# Patient Record
Sex: Female | Born: 1990 | Race: Black or African American | Hispanic: No | Smoking: Never smoker
Health system: Southern US, Community
[De-identification: ages and names within clinical notes are randomized; demographics above are authoritative.]

## PROBLEM LIST (undated history)

## (undated) DIAGNOSIS — Z8742 Personal history of other diseases of the female genital tract: Secondary | ICD-10-CM

## (undated) DIAGNOSIS — J45909 Unspecified asthma, uncomplicated: Secondary | ICD-10-CM

## (undated) DIAGNOSIS — E7800 Pure hypercholesterolemia, unspecified: Secondary | ICD-10-CM

## (undated) HISTORY — PX: NO PAST SURGERIES: SHX2092

## (undated) HISTORY — DX: Personal history of other diseases of the female genital tract: Z87.42

## (undated) HISTORY — DX: Unspecified asthma, uncomplicated: J45.909

## (undated) HISTORY — DX: Pure hypercholesterolemia, unspecified: E78.00

---
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
## 2009-05-14 ENCOUNTER — Ambulatory Visit: Payer: Self-pay | Admitting: Family Medicine

## 2009-12-05 ENCOUNTER — Observation Stay: Payer: Self-pay

## 2009-12-17 ENCOUNTER — Observation Stay: Payer: Self-pay | Admitting: Emergency Medicine

## 2009-12-17 ENCOUNTER — Observation Stay: Payer: Self-pay

## 2010-01-04 ENCOUNTER — Inpatient Hospital Stay: Payer: Self-pay | Admitting: Unknown Physician Specialty

## 2016-03-18 DIAGNOSIS — R635 Abnormal weight gain: Secondary | ICD-10-CM | POA: Diagnosis not present

## 2016-03-18 DIAGNOSIS — Z113 Encounter for screening for infections with a predominantly sexual mode of transmission: Secondary | ICD-10-CM | POA: Diagnosis not present

## 2016-09-29 DIAGNOSIS — Z0000 Encounter for general adult medical examination without abnormal findings: Secondary | ICD-10-CM | POA: Diagnosis not present

## 2016-10-21 DIAGNOSIS — J988 Other specified respiratory disorders: Secondary | ICD-10-CM | POA: Diagnosis not present

## 2016-11-10 DIAGNOSIS — S60460A Insect bite (nonvenomous) of right index finger, initial encounter: Secondary | ICD-10-CM | POA: Diagnosis not present

## 2016-11-10 DIAGNOSIS — Z1389 Encounter for screening for other disorder: Secondary | ICD-10-CM | POA: Diagnosis not present

## 2016-11-10 DIAGNOSIS — S50861A Insect bite (nonvenomous) of right forearm, initial encounter: Secondary | ICD-10-CM | POA: Diagnosis not present

## 2017-05-12 ENCOUNTER — Ambulatory Visit (INDEPENDENT_AMBULATORY_CARE_PROVIDER_SITE_OTHER): Payer: BLUE CROSS/BLUE SHIELD | Admitting: Maternal Newborn

## 2017-05-12 ENCOUNTER — Encounter: Payer: Self-pay | Admitting: Maternal Newborn

## 2017-05-12 VITALS — BP 122/74 | HR 90 | Ht 63.0 in | Wt 157.0 lb

## 2017-05-12 DIAGNOSIS — N939 Abnormal uterine and vaginal bleeding, unspecified: Secondary | ICD-10-CM

## 2017-05-12 DIAGNOSIS — Z3201 Encounter for pregnancy test, result positive: Secondary | ICD-10-CM | POA: Diagnosis not present

## 2017-05-12 DIAGNOSIS — Z01419 Encounter for gynecological examination (general) (routine) without abnormal findings: Secondary | ICD-10-CM

## 2017-05-12 DIAGNOSIS — Z113 Encounter for screening for infections with a predominantly sexual mode of transmission: Secondary | ICD-10-CM | POA: Diagnosis not present

## 2017-05-12 LAB — POCT URINE PREGNANCY: Preg Test, Ur: POSITIVE — AB

## 2017-05-12 NOTE — Progress Notes (Signed)
Gynecology Annual Exam  PCP: Patient, No Pcp Per  Chief Complaint:  Chief Complaint  Patient presents with  . Gynecologic Exam    History of Present Illness: Patient is a 26 y.o. G2P1011 presents for annual exam. The patient has no complaints today.   LMP: No LMP recorded (lmp unknown). Patient is not currently having periods (Reason: Irregular Periods). Average Interval: irregular, has monthly cycle but comes at different times Duration of flow: a few days Heavy Menses: no Clots: no Intermenstrual Bleeding: no Postcoital Bleeding: no Dysmenorrhea: no  The patient is sexually active. She currently uses none for contraception. She denies dyspareunia.  The patient does not perform self breast exams.  There is no notable family history of breast or ovarian cancer in her family.  The patient wears seatbelts: yes.   The patient has regular exercise: yes.    The patient denies current symptoms of depression.    Review of Systems  Constitutional: Negative.   HENT: Negative.   Eyes: Negative.   Respiratory: Negative for cough, shortness of breath and wheezing.   Cardiovascular: Negative for chest pain and palpitations.  Gastrointestinal: Positive for constipation.  Genitourinary: Negative for dysuria, flank pain, frequency and urgency.       Tenderness in right and left lower abdomen  Skin: Negative.   Neurological: Negative.   Endo/Heme/Allergies: Negative.   Psychiatric/Behavioral: Negative.   Breasts: Bilateral tenderness. No nipple discharge or shin changes. All other systems negative.  Past Medical History:  Past Medical History:  Diagnosis Date  . Asthma    mild  . History of abnormal cervical Pap smear 2012, 2013, 2016    Past Surgical History:  Past Surgical History:  Procedure Laterality Date  . NO PAST SURGERIES      Gynecologic History:  No LMP recorded (lmp unknown). Patient is not currently having periods (Reason: Irregular Periods). Contraception:  was using OCP but stopped a few weeks ago because her insurance no longer covered her Lo Lo Estrin Last Pap: Results were: NIL and HR HPV negative   Obstetric History: G2P1011  Family History:  Family History  Problem Relation Age of Onset  . Diabetes Paternal Grandmother     Social History:  Social History   Social History  . Marital status: Single    Spouse name: N/A  . Number of children: N/A  . Years of education: N/A   Occupational History  . Not on file.   Social History Main Topics  . Smoking status: Never Smoker  . Smokeless tobacco: Never Used  . Alcohol use No  . Drug use: No  . Sexual activity: Yes    Birth control/ protection: None   Other Topics Concern  . Not on file   Social History Narrative  . No narrative on file    Allergies:  No Known Allergies  Medications: Prior to Admission medications   Not on File    Physical Exam Vitals: Blood pressure 122/74, pulse 90, height  (1.6 m), weight 157 lb (71.2 kg).  General: NAD HEENT: normocephalic, anicteric Thyroid: no enlargement, no palpable nodules Pulmonary: No increased work of breathing, CTAB Cardiovascular: RRR, distal pulses 2+ Breast: Breasts symmetrical, bilateral tenderness, no palpable nodules or masses, no skin or nipple retraction present, no nipple discharge.  No axillary or supraclavicular lymphadenopathy. Abdomen: NABS, soft, tender in bilateral lower quadrants, non-distended.  Umbilicus without lesions.  No hepatomegaly, splenomegaly or masses palpable. No evidence of hernia  Genitourinary:  External: Normal external  female genitalia.  Normal urethral meatus, normal  Bartholin's and Skene's glands.    Vagina: Normal vaginal mucosa, no evidence of prolapse.    Cervix: Grossly normal in appearance, no bleeding  Uterus: Non-enlarged, mobile, normal contour.  No CMT  Adnexa: ovaries non-enlarged, no adnexal masses  Rectal: deferred  Lymphatic: no evidence of inguinal  lymphadenopathy Extremities: no edema, erythema, or tenderness Neurologic: Grossly intact Psychiatric: mood appropriate, affect full  Assessment: 26 y.o. G2P1011 routine annual exam, positive pregnancy test.  Plan: Problem List Items Addressed This Visit   Visit Diagnoses    Encounter for annual routine gynecological examination    -  Primary   Positive pregnancy test       Papanicolaou smear, as part of routine gynecological examination       Relevant Orders   IGP,CtNgTv,rfx Aptima HPV ASCU   Screening examination for STD (sexually transmitted disease)       Relevant Orders   IGP,CtNgTv,rfx Aptima HPV ASCU      1) STI screening was offered and accepted (GC and trich).  2) ASCCP guidelines and rationale discussed.  Patient opts for every 3 years screening interval.  3) Contraception - we discussed patient's OCPs and she intended to restart a generic version, but she had a positive pregnancy test today.  4) Routine healthcare maintenance including cholesterol, diabetes screening discussed; declines.  5) Follow up for NOB and dating ultrasound.  Marcelyn Bruins, CNM 05/12/2017  1:23 PM

## 2017-05-12 NOTE — Addendum Note (Signed)
Addended by: Reather Littler D on: 05/12/2017 02:34 PM   Modules accepted: Orders

## 2017-05-14 LAB — IGP,CTNGTV,RFX APTIMA HPV ASCU
Chlamydia, Nuc. Acid Amp: NEGATIVE
Gonococcus, Nuc. Acid Amp: NEGATIVE
PAP Smear Comment: 0
Trich vag by NAA: NEGATIVE

## 2017-05-17 ENCOUNTER — Encounter: Payer: Self-pay | Admitting: Obstetrics & Gynecology

## 2017-05-17 ENCOUNTER — Telehealth: Payer: Self-pay

## 2017-05-17 ENCOUNTER — Ambulatory Visit (INDEPENDENT_AMBULATORY_CARE_PROVIDER_SITE_OTHER): Payer: BLUE CROSS/BLUE SHIELD | Admitting: Obstetrics & Gynecology

## 2017-05-17 VITALS — BP 120/80 | HR 102 | Ht 63.0 in | Wt 159.0 lb

## 2017-05-17 DIAGNOSIS — R1032 Left lower quadrant pain: Secondary | ICD-10-CM | POA: Insufficient documentation

## 2017-05-17 NOTE — Telephone Encounter (Signed)
Pt found out she was preg last Wed and over the wkend she had sharp pelvic pain.  Should she be seen or wait for u/s appt Thurs.?  [phone]  Pt states there is a very tender/sore 'ball' in the pantyline.  appt made today c PH at 2:50

## 2017-05-17 NOTE — Progress Notes (Signed)
  Abdominal Pain Patient presents for evaluation of abdominal pain. The pain is described as aching, and is 4/10 in intensity. Pain is located in the LLQ area with radiation to the left groin. Onset was gradual occurring 2 days ago. Symptoms have been unchanged since. Aggravating factors: none. Alleviating factors: none. Associated symptoms: small lump over groin area. The patient denies fever, nausea, sweats, vomiting and vag d/c, itching, burning, UTI sx's. Risk factors for pelvic/abdominal pain include recent dx of pregnancy after having irreg periods (unsure EDC, [place] this week).  PMHx: She  has a past medical history of Asthma and History of abnormal cervical Pap smear (2012, 2013, 2016). Also,  has a past surgical history that includes No past surgeries., family history includes Diabetes in her paternal grandmother.,  reports that she has never smoked. She has never used smokeless tobacco. She reports that she does not drink alcohol or use drugs.  She currently has no medications in their medication list. Also, has No Known Allergies.  Review of Systems  All other systems reviewed and are negative.  Objective: BP 120/80   Pulse (!) 102   Ht  (1.6 m)   Wt 159 lb (72.1 kg)   LMP  (LMP Unknown)   BMI 28.17 kg/m  Physical Exam  Constitutional: She is oriented to person, place, and time. She appears well-developed and well-nourished. No distress.  Genitourinary: Vagina normal and uterus normal. Pelvic exam was performed with patient supine. There is no rash, tenderness or lesion on the right labia. There is no rash, tenderness or lesion on the left labia. No erythema or bleeding in the vagina. Right adnexum does not display mass and does not display tenderness. Left adnexum does not display mass and does not display tenderness. Cervix does not exhibit motion tenderness, discharge, polyp or nabothian cyst.   Uterus is mobile and midaxial. Uterus is not enlarged or exhibiting a mass.    Abdominal: Soft. She exhibits no distension. There is tenderness in the right lower quadrant and left lower quadrant.  Mild LQ T to palpation  Palpable small probable lymph node over left femoral canal  Musculoskeletal: Normal range of motion.  Neurological: She is alert and oriented to person, place, and time. No cranial nerve deficit.  Skin: Skin is warm and dry.  Psychiatric: She has a normal mood and affect.   ASSESSMENT/PLAN:   Problem List Items Addressed This Visit   Visit Diagnoses    Left lower quadrant pain    -  Primary    Round lig pain or LN pain or abnormal pregnancy. [place] as scheduled, sooner if pain intensifies Monitor for abn bleeding  Annamarie Major, MD, [facility], [facility] 05/17/2017  3:35 PM

## 2017-05-20 ENCOUNTER — Encounter: Payer: Self-pay | Admitting: Obstetrics and Gynecology

## 2017-05-20 ENCOUNTER — Ambulatory Visit (INDEPENDENT_AMBULATORY_CARE_PROVIDER_SITE_OTHER): Payer: BLUE CROSS/BLUE SHIELD

## 2017-05-20 ENCOUNTER — Ambulatory Visit (INDEPENDENT_AMBULATORY_CARE_PROVIDER_SITE_OTHER): Payer: BLUE CROSS/BLUE SHIELD | Admitting: Obstetrics and Gynecology

## 2017-05-20 VITALS — BP 118/74 | Wt 160.0 lb

## 2017-05-20 DIAGNOSIS — Z3201 Encounter for pregnancy test, result positive: Secondary | ICD-10-CM

## 2017-05-20 DIAGNOSIS — Z3687 Encounter for antenatal screening for uncertain dates: Secondary | ICD-10-CM | POA: Diagnosis not present

## 2017-05-20 DIAGNOSIS — Z3490 Encounter for supervision of normal pregnancy, unspecified, unspecified trimester: Secondary | ICD-10-CM | POA: Insufficient documentation

## 2017-05-20 DIAGNOSIS — Z3A01 Less than 8 weeks gestation of pregnancy: Secondary | ICD-10-CM

## 2017-05-20 DIAGNOSIS — Z3491 Encounter for supervision of normal pregnancy, unspecified, first trimester: Secondary | ICD-10-CM

## 2017-05-20 NOTE — Progress Notes (Signed)
New Obstetric Patient H&P   Chief Complaint: "Desires prenatal care"   History of Present Illness: Patient is a 26 y.o. G3P1011 Not Hispanic or Latino female, LMP is unsure and she presents with amenorrhea and positive home pregnancy test. Based on her  Ultrasound today, her EDD is Estimated Date of Delivery: 01/01/18 and her EGA is [redacted]w[redacted]d. She initially presented with LLQ abdominal pain, which is resolved now.   Her prior pregnancies are notable for no complications  Since her LMP, she admits to the use of tobacco products  no She claims she has gained zero pounds since the start of her pregnancy.  There are cats in the home in the home  no  She admits close contact with children on a regular basis  yes  She has had chicken pox in the past yes She has had Tuberculosis exposures, symptoms, or previously tested positive for TB   no Current or past history of domestic violence. no  Genetic Screening/Teratology Counseling: (Includes patient, baby's father, or anyone in either family with:)   1. Patient's age >/= 60 at [facility]  no 2. Thalassemia ([place], [place], Mediterranean, or Asian background): MCV<80  no 3. Neural tube defect (meningomyelocele, spina bifida, anencephaly)  no 4. Congenital heart defect  no  5. Down syndrome  no 6. Tay-Sachs (Jewish, [place]  no 7. Canavan's Disease  no 8. Sickle cell disease or trait (African)  Yes. Sickle cell trait in father of baby.  She has previously tested negative for sickle cell trait in her first pregnancy.  9. Hemophilia or other blood disorders  no  10. Muscular dystrophy  no  11. Cystic fibrosis  no  12. Huntington's Chorea  no  13. Mental retardation/autism  no 14. Other inherited genetic or chromosomal disorder  no 15. Maternal metabolic disorder (DM, PKU, etc)  no 16. Patient or FOB with a child with a birth defect not listed above no  16a. Patient or FOB with a birth defect themselves no 17. Recurrent pregnancy loss, or  stillbirth  no  18. Any medications since LMP other than prenatal vitamins (include vitamins, supplements, OTC meds, drugs, alcohol)  no 19. Any other genetic/environmental exposure to discuss  no  Infection History:   1. Lives with someone with TB or TB exposed  no  2. Patient or partner has history of genital herpes  no 3. Rash or viral illness since LMP  no 4. History of STI (GC, CT, HPV, syphilis, HIV)  no 5. History of recent travel :  no  Other pertinent information:  no     Review of Systems:10 point review of systems negative unless otherwise noted in HPI  Past Medical History:  Diagnosis Date  . Asthma    mild  . History of abnormal cervical Pap smear 2012, 2013, 2016    Past Surgical History:  Procedure Laterality Date  . NO PAST SURGERIES      Gynecologic History: No LMP recorded (lmp unknown). Patient is pregnant.  Obstetric History: G3P1011  Family History  Problem Relation Age of Onset  . Diabetes Paternal Grandmother     Social History   Social History  . Marital status: Single    Spouse name: N/A  . Number of children: N/A  . Years of education: N/A   Occupational History  . Not on file.   Social History Main Topics  . Smoking status: Never Smoker  . Smokeless tobacco: Never Used  . Alcohol use No  . Drug  use: No  . Sexual activity: Yes    Birth control/ protection: None   Other Topics Concern  . Not on file   Social History Narrative  . No narrative on file    No Known Allergies  Prior to Admission medications   PNV    Physical Exam BP 118/74   Wt 160 lb (72.6 kg)   LMP  (LMP Unknown)   BMI 28.34 kg/m   Physical Exam  Constitutional: She is oriented to person, place, and time. She appears well-developed and well-nourished. No distress.  HENT:  Head: Normocephalic and atraumatic.  Eyes: Conjunctivae are normal.  Neck: Normal range of motion. Neck supple. No thyromegaly present.  Cardiovascular: Normal rate, regular  rhythm and normal heart sounds.  Exam reveals no gallop and no friction rub.   No murmur heard. Pulmonary/Chest: Effort normal and breath sounds normal. She has no wheezes.  Abdominal: Soft. She exhibits no distension and no mass. There is no tenderness. There is no rebound and no guarding. No hernia.  Genitourinary: Pelvic exam was performed with patient supine.  Musculoskeletal: Normal range of motion.  Lymphadenopathy:    She has no cervical adenopathy.  Neurological: She is alert and oriented to person, place, and time.  Skin: Skin is warm and dry. No rash noted.  Psychiatric: She has a normal mood and affect. Her behavior is normal. Judgment normal.    Pelvic exam previously performed.   Assessment: 26 y.o. G3P1011 at 3731w5d presenting to initiate prenatal care  Plan: 1) Avoid alcoholic beverages. 2) Patient encouraged not to smoke.  3) Discontinue the use of all non-medicinal drugs and chemicals.  4) Take prenatal vitamins daily.  5) Nutrition, food safety (fish, cheese advisories, and high nitrite foods) and exercise discussed. 6) Hospital and practice style discussed with cross coverage system.  7) Genetic Screening, such as with 1st Trimester Screening, cell free fetal DNA, AFP testing, and Ultrasound, as well as with amniocentesis and CVS as appropriate, is discussed with patient. At the conclusion of today's visit patient undecided genetic testing 8) Patient is asked about travel to areas at risk for the [place] virus, and counseled to avoid travel and exposure to mosquitoes or sexual partners who may have themselves been exposed to the virus. Testing is discussed, and will be ordered as appropriate.   Thomasene MohairStephen Sebastion Jun, MD 05/20/2017 1:17 PM

## 2017-06-17 ENCOUNTER — Ambulatory Visit (INDEPENDENT_AMBULATORY_CARE_PROVIDER_SITE_OTHER): Payer: BLUE CROSS/BLUE SHIELD | Admitting: Obstetrics & Gynecology

## 2017-06-17 VITALS — BP 120/80 | Wt 166.0 lb

## 2017-06-17 DIAGNOSIS — Z3A11 11 weeks gestation of pregnancy: Secondary | ICD-10-CM | POA: Diagnosis not present

## 2017-06-17 DIAGNOSIS — Z3491 Encounter for supervision of normal pregnancy, unspecified, first trimester: Secondary | ICD-10-CM

## 2017-06-17 NOTE — Patient Instructions (Signed)

## 2017-06-17 NOTE — Progress Notes (Signed)
  Subjective  Fetal Movement? no Contractions? no Leaking Fluid? no Vaginal Bleeding? No Occas RLQ pain, mild, no radiation, no other sx's.  Objective  BP 120/80   Wt 166 lb (75.3 kg)   LMP  (LMP Unknown)   BMI 29.41 kg/m  General: NAD Pumonary: no increased work of breathing Abdomen: gravid, non-tender Extremities: no edema Psychiatric: mood appropriate, affect full  Clinic WS Prenatal Labs  Dating US Blood type:     Genetic Screen +DECLINES Antibody:   Anatomic US To be scheduled Rubella:    GTT Third trimester:  RPR:     Flu vaccine  HBsAg:     TDaP vaccine                          Rhogam: HIV:     Baby Food                                               GBS: (For PCN allergy, check sensitivities)  Contraception  Pap:  CS/VBAC    Cord Blood    Support Person     Assessment   26 y.o. [id number] at 1337w5d by  01/01/2018, by Ultrasound presenting for routine prenatal visit  Plan   Problem List Items Addressed This Visit      Other   Supervision of low-risk pregnancy   Relevant Orders   RPR+Rh+ABO+Rub Ab+Ab Scr+CB...   Hemoglobinopathy evaluation    Other Visit Diagnoses    [redacted] weeks gestation of pregnancy    -  Primary   Relevant Orders   RPR+Rh+ABO+Rub Ab+Ab Scr+CB...   Hemoglobinopathy evaluation    PNV. Declines genetic screening tests Annamarie MajorPaul Zavien Clubb, MD, [facility] Frederick[facility]AC[facility], [facility] 06/17/2017  10:41 AM

## 2017-06-18 LAB — HEMOGLOBINOPATHY EVALUATION
HGB C: 0 %
HGB S: 0 %
HGB VARIANT: 0 %
Hemoglobin A2 Quantitation: 2.4 % (ref 1.8–3.2)
Hemoglobin F Quantitation: 0 % (ref 0.0–2.0)
Hgb A: 97.6 % (ref 96.4–98.8)

## 2017-06-18 LAB — RPR+RH+ABO+RUB AB+AB SCR+CB...
ANTIBODY SCREEN: NEGATIVE
HEMATOCRIT: 41.5 % (ref 34.0–46.6)
HEMOGLOBIN: 14.4 g/dL (ref 11.1–15.9)
HIV Screen 4th Generation wRfx: NONREACTIVE
Hepatitis B Surface Ag: NEGATIVE
MCH: 31.6 pg (ref 26.6–33.0)
MCHC: 34.7 g/dL (ref 31.5–35.7)
MCV: 91 fL (ref 79–97)
Platelets: 251 10*3/uL (ref 150–379)
RBC: 4.56 x10E6/uL (ref 3.77–5.28)
RDW: 13.5 % (ref 12.3–15.4)
RH TYPE: POSITIVE
RPR Ser Ql: NONREACTIVE
RUBELLA: 20.4 {index} (ref >0.99)
VARICELLA: 629 {index} (ref >165)
WBC: 12 10*3/uL — AB (ref 3.4–10.8)

## 2017-06-29 ENCOUNTER — Encounter: Payer: Self-pay | Admitting: Emergency Medicine

## 2017-06-29 ENCOUNTER — Other Ambulatory Visit: Payer: Self-pay

## 2017-06-29 ENCOUNTER — Emergency Department: Payer: BLUE CROSS/BLUE SHIELD

## 2017-06-29 ENCOUNTER — Emergency Department
Admission: EM | Admit: 2017-06-29 | Discharge: 2017-06-29 | Disposition: A | Discharge status: Home / Self Care | Payer: BLUE CROSS/BLUE SHIELD | Attending: Emergency Medicine | Admitting: Emergency Medicine

## 2017-06-29 DIAGNOSIS — Z3A13 13 weeks gestation of pregnancy: Secondary | ICD-10-CM | POA: Diagnosis not present

## 2017-06-29 DIAGNOSIS — O200 Threatened abortion: Secondary | ICD-10-CM | POA: Diagnosis not present

## 2017-06-29 DIAGNOSIS — O209 Hemorrhage in early pregnancy, unspecified: Secondary | ICD-10-CM | POA: Diagnosis not present

## 2017-06-29 LAB — CBC
HCT: 38.6 % (ref 35.0–47.0)
Hemoglobin: 13.6 g/dL (ref 12.0–16.0)
MCH: 32.1 pg (ref 26.0–34.0)
MCHC: 35.3 g/dL (ref 32.0–36.0)
MCV: 90.8 fL (ref 80.0–100.0)
PLATELETS: 232 10*3/uL (ref 150–440)
RBC: 4.26 MIL/uL (ref 3.80–5.20)
RDW: 13.1 % (ref 11.5–14.5)
WBC: 10 10*3/uL (ref 3.6–11.0)

## 2017-06-29 LAB — HCG, QUANTITATIVE, PREGNANCY: hCG, Beta Chain, Quant, S: 138152 m[IU]/mL — ABNORMAL HIGH (ref <5)

## 2017-06-29 NOTE — ED Provider Notes (Signed)
[facility] Emergency Department Provider Note  Time seen: 5:04 AM  I have reviewed the triage vital signs and the nursing notes.   HISTORY  Chief Complaint Vaginal Bleeding    HPI Sierra Clay is a 26 y.o. female G2 P1 presents to the emergency department for vaginal bleeding proxy [redacted] weeks pregnant.  According to the patient she is [place] [redacted] weeks pregnant had an ultrasound performed by her OB at 7 weeks.  Patient states around 9 PM tonight she began with vaginal spotting/bleeding.  Denies any abdominal pain or cramping.  Denies any fever.  Denies any history of miscarriage or abortion in the past.  Denies any recent intercourse.   Past Medical History:  Diagnosis Date  . Asthma    mild  . History of abnormal cervical Pap smear 2012, 2013, 2016    Patient Active Problem List   Diagnosis Date Noted  . Supervision of low-risk pregnancy 05/20/2017  . Left lower quadrant pain 05/17/2017    Past Surgical History:  Procedure Laterality Date  . NO PAST SURGERIES      Prior to Admission medications   Not on File    No Known Allergies  Family History  Problem Relation Age of Onset  . Diabetes Paternal Grandmother     Social History Social History   Tobacco Use  . Smoking status: Never Smoker  . Smokeless tobacco: Never Used  Substance Use Topics  . Alcohol use: No  . Drug use: No    Review of Systems Constitutional: Negative for fever. Cardiovascular: Negative for chest pain. Respiratory: Negative for shortness of breath. Gastrointestinal: Negative for abdominal pain Genitourinary: Positive for vaginal bleeding All other ROS negative  ____________________________________________   PHYSICAL EXAM:  VITAL SIGNS: ED Triage Vitals  Enc Vitals Group     BP 06/29/17 0448 140/81     Pulse Rate 06/29/17 0448 100     Resp 06/29/17 0448 20     Temp 06/29/17 0448 98 F (36.7 C)     Temp Source 06/29/17 0448 Oral     SpO2  06/29/17 0448 100 %     Weight 06/29/17 0446 166 lb (75.3 kg)     Height 06/29/17 0446 5\' 1"  (1.549 m)     Head Circumference --      Peak Flow --      Pain Score --      Pain Loc --      Pain Edu? --      Excl. in GC? --     Constitutional: Alert and oriented. Well appearing and in no distress. Eyes: Normal exam ENT   Head: Normocephalic and atraumatic   Mouth/Throat: Mucous membranes are moist. Cardiovascular: Normal rate, regular rhythm. No murmur Respiratory: Normal respiratory effort without tachypnea nor retractions. Breath sounds are clear  Gastrointestinal: Soft and nontender. No distention.   Musculoskeletal: Nontender with normal range of motion in all extremities.  Neurologic:  Normal speech and language. No gross focal neurologic deficits Skin:  Skin is warm, dry and intact.  Psychiatric: Mood and affect are normal.   ____________________________________________   RADIOLOGY  IMPRESSION: 1. Appearance highly suspicious for Placenta Previa. 2. Single living IUP with estimated gestational age of [redacted] weeks and 1 day. 3. The cervix appears closed with an estimated length of 4.6 cm. No subchorionic hemorrhage or pelvic free fluid. Normal ovaries.  ____________________________________________   INITIAL IMPRESSION / ASSESSMENT AND PLAN / ED COURSE  Pertinent labs & imaging results that  were available during my care of the patient were reviewed by me and considered in my medical decision making (see chart for details).  Patient presents to the emergency department for vaginal bleeding proximal by [redacted] weeks pregnant.  Differential would include miscarriage, threatened miscarriage, subchorionic hemorrhage.  Overall the patient appears very well, completely nontender abdominal exam.  We will check labs and obtain an ultrasound to further evaluate.  Patient agreeable to this plan of care.  Records reviewed, noncontributory to today's ER visit.  Patient's ultrasound  is highly suspicious for placenta previa otherwise shows a single live IUP at 13 weeks and 1 day no subchorionic hemorrhage noted.  Given the possible placenta previa we will [facility].  Patient's ultrasound shows possible placenta previa otherwise looks normal.  I discussed the patient with Dr. Jean RosenthalJackson of OB/GYN.  Patient will follow-up in the office.  I discussed the findings with the patient she is agreeable to this plan.  ____________________________________________   FINAL CLINICAL IMPRESSION(S) / ED DIAGNOSES  Threatened miscarriage    Minna AntisPaduchowski, Domonic Kimball, MD 06/29/17 [phone]

## 2017-06-29 NOTE — ED Notes (Signed)
Patient transported to Ultrasound 

## 2017-06-29 NOTE — ED Notes (Signed)
Patient returned from US.

## 2017-06-29 NOTE — ED Notes (Signed)
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  Pt denies pain, but states "it was a lot of blood" when asked how much blood was in her undergarments.  Patient in NAD during assessment, with support at bedside.

## 2017-06-29 NOTE — ED Triage Notes (Signed)
Patient ambulatory to triage with steady gait, without difficulty or distress noted; pt reports approx [redacted]wks pregnant, vag bleeding since 10pm with no pain; [facility]

## 2017-07-01 ENCOUNTER — Ambulatory Visit (INDEPENDENT_AMBULATORY_CARE_PROVIDER_SITE_OTHER): Payer: BLUE CROSS/BLUE SHIELD | Admitting: Certified Nurse Midwife

## 2017-07-01 VITALS — BP 102/62 | Wt 164.0 lb

## 2017-07-01 DIAGNOSIS — R112 Nausea with vomiting, unspecified: Secondary | ICD-10-CM

## 2017-07-01 DIAGNOSIS — Z3A13 13 weeks gestation of pregnancy: Secondary | ICD-10-CM

## 2017-07-01 DIAGNOSIS — O4692 Antepartum hemorrhage, unspecified, second trimester: Secondary | ICD-10-CM

## 2017-07-01 MED ORDER — ONDANSETRON HCL 4 MG PO TABS: 4.0000 mg | ORAL_TABLET | Freq: Four times a day (QID) | ORAL | 1 refills | Status: DC | PRN

## 2017-07-01 NOTE — Progress Notes (Signed)
Pt f/u from ER for bleeding, per pt she could fill pad after leaving hospital. She did not bleed at all yesterday until last night.

## 2017-07-03 NOTE — Progress Notes (Signed)
26 yr old G3 P1011 at 13wk5d presents for a follow up visit after being seen in the ER 11/27/ for vaginal bleeding Her bleeding was preceded by a sudden onset of nausea and vomiting on 11/26. Still having some problems with nausea and vomiting Keeping down some fluids and food. No diarrhea Bleeding subsiding, just had some brown discharge last night. Having some BLA cramping (?round ligament pain) Ultrasound 11/27 in ER revealed a viable SIUP with CRL c/w 13wk 1 day, the placenta was 7mm from cx os Blood type O POS Exam: 102/62 General: in NAD Abdomen: soft, NT, FH at U-3, FHTWNL, BS active A: Threatened AB at 13wk5d Low lying placenta Nausea and vomiting-possibly due to GI virus P: Zofran 4 mgm po for nausea and vomiting Clar liquids then bland diet then advance as tolerated Pelvic rest until no bleeding x 2 weeks FU next week as scheduled Sierra Clay, CNM

## 2017-07-14 ENCOUNTER — Ambulatory Visit (INDEPENDENT_AMBULATORY_CARE_PROVIDER_SITE_OTHER): Payer: BLUE CROSS/BLUE SHIELD | Admitting: Obstetrics and Gynecology

## 2017-07-14 ENCOUNTER — Encounter: Payer: BLUE CROSS/BLUE SHIELD | Admitting: Obstetrics and Gynecology

## 2017-07-14 VITALS — BP 116/62 | Wt 170.0 lb

## 2017-07-14 DIAGNOSIS — Z3491 Encounter for supervision of normal pregnancy, unspecified, first trimester: Secondary | ICD-10-CM

## 2017-07-14 DIAGNOSIS — Z3A15 15 weeks gestation of pregnancy: Secondary | ICD-10-CM

## 2017-07-14 DIAGNOSIS — O4440 Low lying placenta NOS or without hemorrhage, unspecified trimester: Secondary | ICD-10-CM

## 2017-07-14 NOTE — Progress Notes (Signed)
    Routine Prenatal Care Visit  Subjective  Sierra Clay is a 26 y.o. G3P1011 at 5822w5d being seen today for ongoing prenatal care.  She is currently monitored for the following issues for this low-risk pregnancy and has Left lower quadrant pain and Supervision of low-risk pregnancy on their problem list.  ----------------------------------------------------------------------------------- Patient reports a small amount of vaginal bleeding.   Contractions: Not present. Vag. Bleeding: Small.   . Denies leaking of fluid.  ----------------------------------------------------------------------------------- The following portions of the patient's history were reviewed and updated as appropriate: allergies, current medications, past family history, past medical history, past social history, past surgical history and problem list. Problem list updated.   Objective  Blood pressure 116/62, weight 170 lb (77.1 kg). Pregravid weight 152 lb (68.9 kg) Total Weight Gain 18 lb (8.165 kg) Urinalysis:      Fetal Status: Fetal Heart Rate (bpm): 160         General:  Alert, oriented and cooperative. Patient is in no acute distress.  Skin: Skin is warm and dry. No rash noted.   Cardiovascular: Normal heart rate noted  Respiratory: Normal respiratory effort, no problems with respiration noted  Abdomen: Soft, gravid, appropriate for gestational age. Pain/Pressure: Absent     Pelvic:  Cervical exam deferred        Extremities: Normal range of motion.     ental Status: Normal mood and affect. Normal behavior. Normal judgment and thought content.     Assessment   26 y.o. G3P1011 at 7022w5d by  01/01/2018, by Ultrasound presenting for routine prenatal visit  Plan   pregnancy Problems (from 05/20/17 to present)    Problem Noted Resolved   Supervision of low-risk pregnancy 05/20/2017 by Conard NovakJackson, Stephen D, MD No   Overview Signed 07/14/2017  2:38 PM by Natale MilchSchuman, Myliah Medel R, MD      [facility]  Prenatal Labs  Dating  Blood type: O/Positive/-- (11/15 1054)   Genetic Screen Declines Antibody:Negative (11/15 1054)  Anatomic US  Rubella: 20.40 (11/15 1054) Varicella: @VZVIGG @  GTT Early:        28 wk:      RPR: Non Reactive (11/15 1054)   Rhogam  HBsAg: Negative (11/15 1054)   TDaP vaccine                       HIV:     Flu Shot                                GBS:   Contraception  Pap:  CBB     CS/VBAC    Baby Food    Support Person                 Preterm labor symptoms and general obstetric precautions including but not limited to vaginal bleeding, contractions, leaking of fluid and fetal movement were reviewed in detail with the patient. Please refer to After Visit Summary for other counseling recommendations.   Patient has a low lying placenta which was suspicious for a previa as of a scan in November. Discussed pelvic rest with patient.   Return in about 4 weeks (around 08/11/2017) for ROB and anatomy US.    Sierra Idlerhristanna Wildon Cuevas MD  07/14/17

## 2017-08-02 DIAGNOSIS — O4440 Low lying placenta NOS or without hemorrhage, unspecified trimester: Secondary | ICD-10-CM | POA: Insufficient documentation

## 2017-08-03 NOTE — L&D Delivery Note (Signed)
  PREOPERATIVE DIAGNOSES: 1. Term pregnancy at 39 wks and 1days Gestational Age 880. Non-Reassuring Fetal Status 3. Meconium 4. Chorioamnionitis  POSTOPERATIVE DIAGNOSES: 1. Term pregnancy at 39 wks and 1days Gestational Age 880. Non-Reassuring Fetal Status 3. Meconium 4. Chorioamnionitis  OPERATION PERFORMED: Vacuum-Assisted Vaginal Delivery  SURGEON: Adelene Idler, MD  ANESTHESIA: Epidural  ESTIMATED BLOOD LOSS: 2665cc  FINDINGS: Delivered a live female weighing 7lbs 4oz with Apgars 5,6,7.  Three-vessel cord and normal placenta.   COMPLICATIONS: None  SITUATION: The patient had been pushing since reaching complete for about one hour and forty-five minutes. She had been progressing well. Fluid had been clear. Chorioamnionitis had been diagnosed and treated with ampicillin and gentamicin. She had a category II tracing while pushing. Just before delivery as she was crowning at the +2 position thick meconium was noted around the head and the infant had fetal heart rate decelerations from the baseline of 150 to the 90-120s occurring after contractions. Maternal exhaustion was apparent. For this reason the decision was made to expedite delivery with a vacuum-assisted delivery.   DESCRIPTION OF THE PROCEDURE:   The baby's head was confirmed to be in the OA presentation with 100% effacement and +2 station. The perineum was injected with 1% lidocaine and a episiotomy was made with the scissors. The bladder was drained. The vacuum was placed and the correct placement in front of the posterior fontanelle was confirmed digitally. With the patient's next contraction, the vacuum was inflated and a gentle pressure was used to assist with maternal pushes to deliver the baby's head. In total there were 2 pulls and no pop-offs. The vacuum was released between contractions.  After delivery of the head, the vacuum was deflated and removed. There was not a nuchal cord.The anterior left shoulder was delivered  with gentle downward guidance followed by delivery of the posterior right shoulder with gentle upward guidance. The infant was placed on the maternal chest.  The cord was clamped x2 and cut. The infant was handed to the neonatalogist.   Pitocin was added to the patient's IV fluids. The placenta delivered spontaneously, was intact and had a three-vessel cord. After delivery the patient began having heavy vaginal bleeding. Uterine massage, evacuation of clots, drainage of the bladder, and administration of methergine, hemabate, and cytotec was performed. A second IV was started and she was given a fluid bolus.  The patient continued to have heavy vaginal bleeding. On exam there was lower uterine segment atony. The patient was given  of stadol for pain control. A bakri balloon was placed and inflated with 600cc of normal saline. After this there was good hemostasis. A vaginal inspection revealed the episotomy without extension. The episiotomy  was repaired with #2-0 and #3-0 Vicryl suture in a running fashion with local anesthesia.   At the end of the delivery mom and baby were recovering in stable condition.  Sponge, instrument, and needle counts were correct times two. Baby was initially taken to the special care nursery, but he was brought back to the room and was doing well with skin-to-skin.   Adelene Idler MD [facility], [facility] 12/26/17 7:50 PM

## 2017-08-11 ENCOUNTER — Ambulatory Visit (INDEPENDENT_AMBULATORY_CARE_PROVIDER_SITE_OTHER): Payer: BLUE CROSS/BLUE SHIELD | Admitting: Maternal Newborn

## 2017-08-11 ENCOUNTER — Encounter: Payer: Self-pay | Admitting: Maternal Newborn

## 2017-08-11 ENCOUNTER — Encounter: Payer: BLUE CROSS/BLUE SHIELD | Admitting: Certified Nurse Midwife

## 2017-08-11 ENCOUNTER — Ambulatory Visit (INDEPENDENT_AMBULATORY_CARE_PROVIDER_SITE_OTHER): Payer: BLUE CROSS/BLUE SHIELD

## 2017-08-11 VITALS — BP 120/60 | Wt 176.0 lb

## 2017-08-11 DIAGNOSIS — Z3491 Encounter for supervision of normal pregnancy, unspecified, first trimester: Secondary | ICD-10-CM | POA: Diagnosis not present

## 2017-08-11 DIAGNOSIS — Z362 Encounter for other antenatal screening follow-up: Secondary | ICD-10-CM | POA: Diagnosis not present

## 2017-08-11 DIAGNOSIS — Z3492 Encounter for supervision of normal pregnancy, unspecified, second trimester: Secondary | ICD-10-CM

## 2017-08-11 DIAGNOSIS — Z3689 Encounter for other specified antenatal screening: Secondary | ICD-10-CM

## 2017-08-11 DIAGNOSIS — Z3A19 19 weeks gestation of pregnancy: Secondary | ICD-10-CM

## 2017-08-11 NOTE — Progress Notes (Signed)
    Routine Prenatal Care Visit  Subjective  Sierra Clay is a 27 y.o. G3P1011 at 5631w4d being seen today for ongoing prenatal care.  She is currently monitored for the following issues for this low-risk pregnancy and has Left lower quadrant pain; Supervision of low-risk pregnancy; and Low-lying placenta on their problem list.  ----------------------------------------------------------------------------------- Patient reports fatigue.   Contractions: Not present. Vag. Bleeding: None. Denies leaking of fluid.  ----------------------------------------------------------------------------------- The following portions of the patient's history were reviewed and updated as appropriate: allergies, current medications, past family history, past medical history, past social history, past surgical history and problem list. Problem list updated.   Objective  Blood pressure 120/60, weight 176 lb (79.8 kg). Pregravid weight 152 lb (68.9 kg) Total Weight Gain 24 lb (10.9 kg) Urinalysis: Urine Protein: Negative Urine Glucose: Negative  Fetal Status: Fetal Heart Rate (bpm): 145         General:  Alert, oriented and cooperative. Patient is in no acute distress.  Skin: Skin is warm and dry. No rash noted.   Cardiovascular: Normal heart rate noted  Respiratory: Normal respiratory effort, no problems with respiration noted  Abdomen: Soft, gravid, appropriate for gestational age. Pain/Pressure: Absent     Pelvic:  Cervical exam deferred        Extremities: Normal range of motion.  Edema: None  Mental Status: Normal mood and affect. Normal behavior. Normal judgment and thought content.     Assessment   26 y.o. G3P1011 at 431w4d EDD 01/01/2018 by Ultrasound presenting for routine prenatal visit.  Plan   pregnancy Problems (from 05/20/17 to present)    Problem Noted Resolved   Low-lying placenta 08/02/2017 by Natale MilchSchuman, Christanna R, MD No   Supervision of low-risk pregnancy 05/20/2017 by Conard NovakJackson,  Stephen D, MD No   Overview Addendum 07/15/2017  9:52 AM by Natale MilchSchuman, Christanna R, MD      [facility] Prenatal Labs  Dating  T1 US Blood type: O/Positive/-- (11/15 1054)   Genetic Screen Declines Antibody:Negative (11/15 1054)  Anatomic US  Rubella: 20.40 (11/15 1054) Varicella: @VZVIGG @  GTT Early:        28 wk:      RPR: Non Reactive (11/15 1054)   Rhogam  HBsAg: Negative (11/15 1054)   TDaP vaccine                       HIV:     Flu Shot                                GBS:   Contraception  Pap:  CBB     CS/VBAC    Baby Food    Support Person               Anatomy ultrasound needs follow up for heart and placentation.   Discussed frequent rest periods and pacing activities to help with fatigue.  Preterm labor symptoms and general obstetric precautions including but not limited to vaginal bleeding, contractions, leaking of fluid and fetal movement were reviewed in detail with the patient.  Please refer to After Visit Summary for other counseling recommendations.   Return in about 4 weeks (around 09/08/2017) for ROB and anatomy US f/u.  Marcelyn BruinsJacelyn Schmid, CNM 08/11/2017  12:09 PM

## 2017-08-11 NOTE — Patient Instructions (Signed)

## 2017-08-11 NOTE — Progress Notes (Signed)
C/o energy level.rj

## 2017-08-12 ENCOUNTER — Other Ambulatory Visit: Payer: Self-pay | Admitting: Obstetrics & Gynecology

## 2017-08-12 NOTE — Progress Notes (Signed)
Review of ULTRASOUND.    [facility] personally reviewed images and [facility]    Plan of management to be discussed with patient.CNM Schmid Placenta post, normal Anat check complete  Sierra MajorPaul Deetra Booton, MD, Merlinda FrederickFACOG [facility], [facility] 08/12/2017  10:52 AM

## 2017-09-10 ENCOUNTER — Other Ambulatory Visit: Payer: BLUE CROSS/BLUE SHIELD

## 2017-09-13 ENCOUNTER — Ambulatory Visit (INDEPENDENT_AMBULATORY_CARE_PROVIDER_SITE_OTHER): Payer: BLUE CROSS/BLUE SHIELD

## 2017-09-13 ENCOUNTER — Ambulatory Visit (INDEPENDENT_AMBULATORY_CARE_PROVIDER_SITE_OTHER): Payer: BLUE CROSS/BLUE SHIELD | Admitting: Obstetrics and Gynecology

## 2017-09-13 VITALS — BP 114/60 | Wt 182.0 lb

## 2017-09-13 DIAGNOSIS — Z3689 Encounter for other specified antenatal screening: Secondary | ICD-10-CM

## 2017-09-13 DIAGNOSIS — Z3492 Encounter for supervision of normal pregnancy, unspecified, second trimester: Secondary | ICD-10-CM

## 2017-09-13 DIAGNOSIS — Z3A24 24 weeks gestation of pregnancy: Secondary | ICD-10-CM

## 2017-09-13 DIAGNOSIS — O4440 Low lying placenta NOS or without hemorrhage, unspecified trimester: Secondary | ICD-10-CM

## 2017-09-13 NOTE — Progress Notes (Signed)
ROB Anatomy scan  Back pain Swollen feet/fingers

## 2017-09-13 NOTE — Progress Notes (Signed)
Routine Prenatal Care Visit  Subjective  Sierra Clay is a 27 y.o. G3P1011 at [redacted]w[redacted]d being seen today for ongoing prenatal care.  She is currently monitored for the following issues for this low-risk pregnancy and has Left lower quadrant pain; Supervision of low-risk pregnancy; and Low-lying placenta on their problem list.  ----------------------------------------------------------------------------------- Patient reports no complaints.   Contractions: Not present. Vag. Bleeding: None.  Movement: Present. Denies leaking of fluid.  ----------------------------------------------------------------------------------- The following portions of the patient's history were reviewed and updated as appropriate: allergies, current medications, past family history, past medical history, past social history, past surgical history and problem list. Problem list updated.   Objective  Blood pressure 114/60, weight 182 lb (82.6 kg). Pregravid weight 152 lb (68.9 kg) Total Weight Gain 30 lb (13.6 kg) Urinalysis: Urine Protein: Negative Urine Glucose: Negative  Fetal Status: Fetal Heart Rate (bpm): 155 Fundal Height: 24 cm Movement: Present     General:  Alert, oriented and cooperative. Patient is in no acute distress.  Skin: Skin is warm and dry. No rash noted.   Cardiovascular: Normal heart rate noted  Respiratory: Normal respiratory effort, no problems with respiration noted  Abdomen: Soft, gravid, appropriate for gestational age. Pain/Pressure: Absent     Pelvic:  Cervical exam deferred        Extremities: Normal range of motion.     ental Status: Normal mood and affect. Normal behavior. Normal judgment and thought content.   US Ob Follow Up  Result Date: 09/13/2017 ULTRASOUND REPORT Location: [facility] Date of Service: 09/13/2017 Indications:F/U Anatomy Findings: Sierra Clay intrauterine pregnancy is visualized with FHR at 157 BPM. Fetal presentation is Breech. Placenta: Posterior, Grade  0, 2.46cm from internal cervical os. AFI: subjectively normal. Anatomic survey is complete. Survey of the adnexa demonstrates no adnexal masses. There is no free peritoneal fluid in the cul de sac. Impression: 1. [redacted]w[redacted]d Viable Singleton Intrauterine pregnancy previously established criteria. 2. Normal Anatomy Scan is now complete 3. Placenta is no longer low-lying. Recommendations: 1.Clinical correlation with the patient's History and Physical Exam. Sierra Clay, RDMS, RVT  There is a singleton gestation with subjectively normal amniotic fluid volume. Follow up evaluation of the fetal anatomy was performed.The fetal anatomical survey appears within normal limits within the resolution of ultrasound as described above.  Placenta has moved away from the cervical os.  It must be noted that a normal ultrasound is unable to rule out fetal aneuploidy.  Sierra Austria, MD, Evern Core [facility], [facility] 09/13/2017, 5:32 PM    Assessment   27 y.o. G3P1011 at [redacted]w[redacted]d by  01/01/2018, by Ultrasound presenting for routine prenatal visit  Plan   pregnancy Problems (from 05/20/17 to present)    Problem Noted Resolved   Low-lying placenta 08/02/2017 by Sierra Milch, MD No   Overview Signed 09/13/2017 10:46 AM by Sierra Austria, MD    Resolved 2.5cm at 24 weeks      Supervision of low-risk pregnancy 05/20/2017 by Sierra Novak, MD No   Overview Addendum 07/15/2017  9:52 AM by Sierra Milch, MD      [facility] Prenatal Labs  Dating  T1 [place] Blood type: O/Positive/-- (11/15 1054)   Genetic Screen Declines Antibody:Negative (11/15 1054)  Anatomic [place]  Rubella: 20.40 (11/15 1054) Varicella: @VZVIGG @  GTT Early:        28 wk:      RPR: Non Reactive (11/15 1054)   Rhogam  HBsAg: Negative (11/15 1054)   TDaP  vaccine                       HIV:     Flu Shot                                GBS:   Contraception  Pap:  CBB     CS/VBAC    Baby Food    Support Person                   Gestational age appropriate obstetric precautions including but not limited to vaginal bleeding, contractions, leaking of fluid and fetal movement were reviewed in detail with the patient.   - low lying placenta resolved anatomy scan complete  Return in about 4 weeks (around 10/11/2017) for ROB and 28 week labs.  Sierra AustriaAndreas Novis League, MD, Evern CoreFACOG [facility], [facility] 09/13/2017, 8:01 PM

## 2017-10-07 ENCOUNTER — Telehealth: Payer: Self-pay

## 2017-10-07 NOTE — Telephone Encounter (Signed)
27 wk OB pt uncertain if she has a cold or if she needs to be seen. Uncertain what she can take. [id number]#[phone]#[phone]

## 2017-10-07 NOTE — Telephone Encounter (Signed)
Spoke with patient and reviewed safe meds/comfort measures. Recommended follow up if s/s worsen.

## 2017-10-07 NOTE — Telephone Encounter (Signed)
Spoke w/pt. She has runny nose, itchy/scratchy throat, cough, ear pain & green drainage/mucus. Temp 100 prior to Tylenol. Advised can take OTC Mucinex, Robitussin & either Claritin or Zyrtec. Notified will send message to provider to see if any rx meds are needed. Pt has rob apt on Monday. Pt advised to contact us back is s&s worsen.

## 2017-10-11 ENCOUNTER — Other Ambulatory Visit: Payer: BLUE CROSS/BLUE SHIELD

## 2017-10-11 ENCOUNTER — Ambulatory Visit (INDEPENDENT_AMBULATORY_CARE_PROVIDER_SITE_OTHER): Payer: BLUE CROSS/BLUE SHIELD | Admitting: Obstetrics & Gynecology

## 2017-10-11 ENCOUNTER — Encounter: Payer: BLUE CROSS/BLUE SHIELD | Admitting: Certified Nurse Midwife

## 2017-10-11 VITALS — BP 112/62 | Wt 190.0 lb

## 2017-10-11 DIAGNOSIS — Z3492 Encounter for supervision of normal pregnancy, unspecified, second trimester: Secondary | ICD-10-CM | POA: Diagnosis not present

## 2017-10-11 DIAGNOSIS — Z3493 Encounter for supervision of normal pregnancy, unspecified, third trimester: Secondary | ICD-10-CM

## 2017-10-11 DIAGNOSIS — Z3A24 24 weeks gestation of pregnancy: Secondary | ICD-10-CM | POA: Diagnosis not present

## 2017-10-11 DIAGNOSIS — Z3A28 28 weeks gestation of pregnancy: Secondary | ICD-10-CM

## 2017-10-11 NOTE — Patient Instructions (Signed)
Third Trimester of Pregnancy The third trimester is from week 28 through week 40 (months 7 through 9). The third trimester is a time when the unborn baby (fetus) is growing rapidly. At the end of the ninth month, the fetus is about 20 inches in length and weighs 6-10 pounds. Body changes during your third trimester Your body will continue to go through many changes during pregnancy. The changes vary from woman to woman. During the third trimester:  Your weight will continue to increase. You can expect to gain 25-35 pounds (11-16 kg) by the end of the pregnancy.  You may begin to get stretch marks on your hips, abdomen, and breasts.  You may urinate more often because the fetus is moving lower into your pelvis and pressing on your bladder.  You may develop or continue to have heartburn. This is caused by increased hormones that slow down muscles in the digestive tract.  You may develop or continue to have constipation because increased hormones slow digestion and cause the muscles that push waste through your intestines to relax.  You may develop hemorrhoids. These are swollen veins (varicose veins) in the rectum that can itch or be painful.  You may develop swollen, bulging veins (varicose veins) in your legs.  You may have increased body aches in the pelvis, back, or thighs. This is due to weight gain and increased hormones that are relaxing your joints.  You may have changes in your hair. These can include thickening of your hair, rapid growth, and changes in texture. Some women also have hair loss during or after pregnancy, or hair that feels dry or thin. Your hair will most likely return to normal after your baby is born.  Your breasts will continue to grow and they will continue to become tender. A yellow fluid (colostrum) may leak from your breasts. This is the first milk you are producing for your baby.  Your belly button may stick out.  You may notice more swelling in your hands,  face, or ankles.  You may have increased tingling or numbness in your hands, arms, and legs. The skin on your belly may also feel numb.  You may feel short of breath because of your expanding uterus.  You may have more problems sleeping. This can be caused by the size of your belly, increased need to urinate, and an increase in your body's metabolism.  You may notice the fetus "dropping," or moving lower in your abdomen (lightening).  You may have increased vaginal discharge.  You may notice your joints feel loose and you may have pain around your pelvic bone.  What to expect at prenatal visits You will have prenatal exams every 2 weeks until week 36. Then you will have weekly prenatal exams. During a routine prenatal visit:  You will be weighed to make sure you and the baby are growing normally.  Your blood pressure will be taken.  Your abdomen will be measured to track your baby's growth.  The fetal heartbeat will be listened to.  Any test results from the previous visit will be discussed.  You may have a cervical check near your due date to see if your cervix has softened or thinned (effaced).  You will be tested for Group B streptococcus. This happens between 35 and 37 weeks.  Your health care provider may ask you:  What your birth plan is.  How you are feeling.  If you are feeling the baby move.  If you have had   any abnormal symptoms, such as leaking fluid, bleeding, severe headaches, or abdominal cramping.  If you are using any tobacco products, including cigarettes, chewing tobacco, and electronic cigarettes.  If you have any questions.  Other tests or screenings that may be performed during your third trimester include:  Blood tests that check for low iron levels (anemia).  Fetal testing to check the health, activity level, and growth of the fetus. Testing is done if you have certain medical conditions or if there are problems during the  pregnancy.  Nonstress test (NST). This test checks the health of your baby to make sure there are no signs of problems, such as the baby not getting enough oxygen. During this test, a belt is placed around your belly. The baby is made to move, and its heart rate is monitored during movement.  What is false labor? False labor is a condition in which you feel small, irregular tightenings of the muscles in the womb (contractions) that usually go away with rest, changing position, or drinking water. These are called Braxton Hicks contractions. Contractions may last for hours, days, or even weeks before true labor sets in. If contractions come at regular intervals, become more frequent, increase in intensity, or become painful, you should see your health care provider. What are the signs of labor?  Abdominal cramps.  Regular contractions that start at 10 minutes apart and become stronger and more frequent with time.  Contractions that start on the top of the uterus and spread down to the lower abdomen and back.  Increased pelvic pressure and dull back pain.  A watery or bloody mucus discharge that comes from the vagina.  Leaking of amniotic fluid. This is also known as your "water breaking." It could be a slow trickle or a gush. Let your health care provider know if it has a color or strange odor. If you have any of these signs, call your health care provider right away, even if it is before your due date. Follow these instructions at home: Medicines  Follow your health care provider's instructions regarding medicine use. Specific medicines may be either safe or unsafe to take during pregnancy.  Take a prenatal vitamin that contains at least 600 micrograms (mcg) of folic acid.  If you develop constipation, try taking a stool softener if your health care provider approves. Eating and drinking  Eat a balanced diet that includes fresh fruits and vegetables, whole grains, good sources of protein  such as meat, eggs, or tofu, and low-fat dairy. Your health care provider will help you determine the amount of weight gain that is right for you.  Avoid raw meat and uncooked cheese. These carry germs that can cause birth defects in the baby.  If you have low calcium intake from food, talk to your health care provider about whether you should take a daily calcium supplement.  Eat four or five small meals rather than three large meals a day.  Limit foods that are high in fat and processed sugars, such as fried and sweet foods.  To prevent constipation: ? Drink enough fluid to keep your urine clear or pale yellow. ? Eat foods that are high in fiber, such as fresh fruits and vegetables, whole grains, and beans. Activity  Exercise only as directed by your health care provider. Most women can continue their usual exercise routine during pregnancy. Try to exercise for 30 minutes at least 5 days a week. Stop exercising if you experience uterine contractions.  Avoid heavy   lifting.  Do not exercise in extreme heat or humidity, or at high altitudes.  Wear low-heel, comfortable shoes.  Practice good posture.  You may continue to have sex unless your health care provider tells you otherwise. Relieving pain and discomfort  Take frequent breaks and rest with your legs elevated if you have leg cramps or low back pain.  Take warm sitz baths to soothe any pain or discomfort caused by hemorrhoids. Use hemorrhoid cream if your health care provider approves.  Wear a good support bra to prevent discomfort from breast tenderness.  If you develop varicose veins: ? Wear support pantyhose or compression stockings as told by your healthcare provider. ? Elevate your feet for 15 minutes, 3-4 times a day. Prenatal care  Write down your questions. Take them to your prenatal visits.  Keep all your prenatal visits as told by your health care provider. This is important. Safety  Wear your seat belt at  all times when driving.  Make a list of emergency phone numbers, including numbers for family, friends, the hospital, and police and fire departments. General instructions  Avoid cat litter boxes and soil used by cats. These carry germs that can cause birth defects in the baby. If you have a cat, ask someone to clean the litter box for you.  Do not travel far distances unless it is absolutely necessary and only with the approval of your health care provider.  Do not use hot tubs, steam rooms, or saunas.  Do not drink alcohol.  Do not use any products that contain nicotine or tobacco, such as cigarettes and e-cigarettes. If you need help quitting, ask your health care provider.  Do not use any medicinal herbs or unprescribed drugs. These chemicals affect the formation and growth of the baby.  Do not douche or use tampons or scented sanitary pads.  Do not cross your legs for long periods of time.  To prepare for the arrival of your baby: ? Take prenatal classes to understand, practice, and ask questions about labor and delivery. ? Make a trial run to the hospital. ? Visit the hospital and tour the maternity area. ? Arrange for maternity or paternity leave through employers. ? Arrange for family and friends to take care of pets while you are in the hospital. ? Purchase a rear-facing car seat and make sure you know how to install it in your car. ? Pack your hospital bag. ? Prepare the baby's nursery. Make sure to remove all pillows and stuffed animals from the baby's crib to prevent suffocation.  Visit your dentist if you have not gone during your pregnancy. Use a soft toothbrush to brush your teeth and be gentle when you floss. Contact a health care provider if:  You are unsure if you are in labor or if your water has broken.  You become dizzy.  You have mild pelvic cramps, pelvic pressure, or nagging pain in your abdominal area.  You have lower back pain.  You have persistent  nausea, vomiting, or diarrhea.  You have an unusual or bad smelling vaginal discharge.  You have pain when you urinate. Get help right away if:  Your water breaks before 37 weeks.  You have regular contractions less than 5 minutes apart before 37 weeks.  You have a fever.  You are leaking fluid from your vagina.  You have spotting or bleeding from your vagina.  You have severe abdominal pain or cramping.  You have rapid weight loss or weight gain.    You have shortness of breath with chest pain.  You notice sudden or extreme swelling of your face, hands, ankles, feet, or legs.  Your baby makes fewer than 10 movements in 2 hours.  You have severe headaches that do not go away when you take medicine.  You have vision changes. Summary  The third trimester is from week 28 through week 40, months 7 through 9. The third trimester is a time when the unborn baby (fetus) is growing rapidly.  During the third trimester, your discomfort may increase as you and your baby continue to gain weight. You may have abdominal, leg, and back pain, sleeping problems, and an increased need to urinate.  During the third trimester your breasts will keep growing and they will continue to become tender. A yellow fluid (colostrum) may leak from your breasts. This is the first milk you are producing for your baby.  False labor is a condition in which you feel small, irregular tightenings of the muscles in the womb (contractions) that eventually go away. These are called Braxton Hicks contractions. Contractions may last for hours, days, or even weeks before true labor sets in.  Signs of labor can include: abdominal cramps; regular contractions that start at 10 minutes apart and become stronger and more frequent with time; watery or bloody mucus discharge that comes from the vagina; increased pelvic pressure and dull back pain; and leaking of amniotic fluid. This information is not intended to replace advice  given to you by your health care provider. Make sure you discuss any questions you have with your health care provider. Document Released: 07/14/2001 Document Revised: 12/26/2015 Document Reviewed: 09/20/2012 Elsevier Interactive Patient Education  2017 [employer]  

## 2017-10-11 NOTE — Progress Notes (Signed)
  Subjective  Fetal Movement? yes Contractions? no Leaking Fluid? no Vaginal Bleeding? no C/O Rt hand going numb since last visit.  Objective  BP 112/62   Wt 190 lb (86.2 kg)   LMP  (LMP Unknown)   BMI 35.90 kg/m  General: NAD Pumonary: no increased work of breathing Abdomen: gravid, non-tender Extremities: no edema Psychiatric: mood appropriate, affect full  Assessment  27 y.o. G3P1011 at 7367w2d by  01/01/2018, by Ultrasound presenting for routine prenatal visit  Plan   Problem List Items Addressed This Visit      Other   Supervision of low-risk pregnancy    Other Visit Diagnoses    [redacted] weeks gestation of pregnancy    -  Primary    28 wk labs today.  Carpal tunnel discussed Sudafed, Tylenol, and Robitussin advised for cold like sx's Plans to breast feed, Considering IUD Declines flu shot  Annamarie MajorPaul Stasia Somero, MD, Merlinda FrederickFACOG [facility], [facility] 10/11/2017  10:28 AM

## 2017-10-12 LAB — 28 WEEK RH+PANEL
BASOS: 0 %
Basophils Absolute: 0 10*3/uL (ref 0.0–0.2)
EOS (ABSOLUTE): 0.1 10*3/uL (ref 0.0–0.4)
Eos: 1 %
Gestational Diabetes Screen: 126 mg/dL (ref 65–139)
HEMATOCRIT: 37 % (ref 34.0–46.6)
HEMOGLOBIN: 11.9 g/dL (ref 11.1–15.9)
HIV Screen 4th Generation wRfx: NONREACTIVE
IMMATURE GRANS (ABS): 0.2 10*3/uL — AB (ref 0.0–0.1)
Immature Granulocytes: 1 %
LYMPHS ABS: 2.5 10*3/uL (ref 0.7–3.1)
LYMPHS: 17 %
MCH: 30.1 pg (ref 26.6–33.0)
MCHC: 32.2 g/dL (ref 31.5–35.7)
MCV: 93 fL (ref 79–97)
MONOCYTES: 4 %
Monocytes Absolute: 0.5 10*3/uL (ref 0.1–0.9)
Neutrophils Absolute: 11.1 10*3/uL — ABNORMAL HIGH (ref 1.4–7.0)
Neutrophils: 77 %
Platelets: 251 10*3/uL (ref 150–379)
RBC: 3.96 x10E6/uL (ref 3.77–5.28)
RDW: 13.1 % (ref 12.3–15.4)
RPR Ser Ql: NONREACTIVE
WBC: 14.4 10*3/uL — AB (ref 3.4–10.8)

## 2017-10-14 ENCOUNTER — Encounter: Payer: Self-pay | Admitting: Obstetrics and Gynecology

## 2017-10-25 ENCOUNTER — Ambulatory Visit (INDEPENDENT_AMBULATORY_CARE_PROVIDER_SITE_OTHER): Payer: BLUE CROSS/BLUE SHIELD | Admitting: Advanced Practice Midwife

## 2017-10-25 ENCOUNTER — Encounter: Payer: Self-pay | Admitting: Advanced Practice Midwife

## 2017-10-25 VITALS — BP 118/74 | Wt 192.0 lb

## 2017-10-25 DIAGNOSIS — Z23 Encounter for immunization: Secondary | ICD-10-CM | POA: Diagnosis not present

## 2017-10-25 DIAGNOSIS — Z3A30 30 weeks gestation of pregnancy: Secondary | ICD-10-CM

## 2017-10-25 NOTE — Progress Notes (Signed)
  Routine Prenatal Care Visit  Subjective  Sierra Clay is a 27 y.o. G3P1011 at 10910w2d being seen today for ongoing prenatal care.  She is currently monitored for the following issues for this low-risk pregnancy and has Left lower quadrant pain and Supervision of low-risk pregnancy on their problem list.  ----------------------------------------------------------------------------------- Patient reports no complaints.   Contractions: Not present. Vag. Bleeding: None.  Movement: Present. Denies leaking of fluid.  ----------------------------------------------------------------------------------- The following portions of the patient's history were reviewed and updated as appropriate: allergies, current medications, past family history, past medical history, past social history, past surgical history and problem list. Problem list updated.   Objective  Blood pressure 118/74, weight 192 lb (87.1 kg). Pregravid weight 152 lb (68.9 kg) Total Weight Gain 40 lb (18.1 kg) Urinalysis:      Fetal Status: Fetal Heart Rate (bpm): 148 Fundal Height: 31 cm Movement: Present     General:  Alert, oriented and cooperative. Patient is in no acute distress.  Skin: Skin is warm and dry. No rash noted.   Cardiovascular: Normal heart rate noted  Respiratory: Normal respiratory effort, no problems with respiration noted  Abdomen: Soft, gravid, appropriate for gestational age. Pain/Pressure: Absent     Pelvic:  Cervical exam deferred        Extremities: Normal range of motion.  Edema: None  Mental Status: Normal mood and affect. Normal behavior. Normal judgment and thought content.   Assessment   27 y.o. G3P1011 at 1110w2d by  01/01/2018, by Ultrasound presenting for routine prenatal visit  Plan   pregnancy Problems (from 05/20/17 to present)    Problem Noted Resolved   Supervision of low-risk pregnancy 05/20/2017 by Conard NovakJackson, Stephen D, MD No   Overview Addendum 10/14/2017 11:40 AM by Vena AustriaStaebler, Andreas,  MD      [facility]inic [facility] Prenatal Labs  Dating  T1 US Blood type: O/Positive/-- (11/15 1054)   Genetic Screen Declines Antibody:Negative (11/15 1054)  Anatomic US Complete 09/13/2017 Rubella: 20.40 (11/15 1054) Varicella: Immune  GTT 128 RPR: Non Reactive (11/15 1054)   Rhogam  HBsAg: Negative (11/15 1054)   TDaP vaccine  10/25/17                     HIV:   Non Reactive  Flu Shot      declined                          GBS:   Contraception       IUD Pap: 05/14/2017, NILM  CBB     CS/VBAC    Baby Food      Breast   Support Person             Low-lying placenta 08/02/2017 by Natale MilchSchuman, Christanna R, MD 10/11/2017 by Nadara MustardHarris, Robert P, MD   Overview Signed 09/13/2017 10:46 AM by Vena AustriaStaebler, Andreas, MD    Resolved 2.5cm at 24 weeks          Preterm labor symptoms and general obstetric precautions including but not limited to vaginal bleeding, contractions, leaking of fluid and fetal movement were reviewed in detail with the patient. Please refer to After Visit Summary for other counseling recommendations.  Recommend increase in activity for healthy weight gain.  Return in about 2 weeks (around 11/08/2017) for rob.  Tresea MallJane Javoni Lucken, CNM 10/25/2017 9:41 AM

## 2017-10-25 NOTE — Progress Notes (Signed)
No vb. No lof.  TDAp today

## 2017-10-25 NOTE — Patient Instructions (Signed)
Third Trimester of Pregnancy The third trimester is from week 28 through week 40 (months 7 through 9). The third trimester is a time when the unborn baby (fetus) is growing rapidly. At the end of the ninth month, the fetus is about 20 inches in length and weighs 6-10 pounds. Body changes during your third trimester Your body will continue to go through many changes during pregnancy. The changes vary from woman to woman. During the third trimester:  Your weight will continue to increase. You can expect to gain 25-35 pounds (11-16 kg) by the end of the pregnancy.  You may begin to get stretch marks on your hips, abdomen, and breasts.  You may urinate more often because the fetus is moving lower into your pelvis and pressing on your bladder.  You may develop or continue to have heartburn. This is caused by increased hormones that slow down muscles in the digestive tract.  You may develop or continue to have constipation because increased hormones slow digestion and cause the muscles that push waste through your intestines to relax.  You may develop hemorrhoids. These are swollen veins (varicose veins) in the rectum that can itch or be painful.  You may develop swollen, bulging veins (varicose veins) in your legs.  You may have increased body aches in the pelvis, back, or thighs. This is due to weight gain and increased hormones that are relaxing your joints.  You may have changes in your hair. These can include thickening of your hair, rapid growth, and changes in texture. Some women also have hair loss during or after pregnancy, or hair that feels dry or thin. Your hair will most likely return to normal after your baby is born.  Your breasts will continue to grow and they will continue to become tender. A yellow fluid (colostrum) may leak from your breasts. This is the first milk you are producing for your baby.  Your belly button may stick out.  You may notice more swelling in your hands,  face, or ankles.  You may have increased tingling or numbness in your hands, arms, and legs. The skin on your belly may also feel numb.  You may feel short of breath because of your expanding uterus.  You may have more problems sleeping. This can be caused by the size of your belly, increased need to urinate, and an increase in your body's metabolism.  You may notice the fetus "dropping," or moving lower in your abdomen (lightening).  You may have increased vaginal discharge.  You may notice your joints feel loose and you may have pain around your pelvic bone.  What to expect at prenatal visits You will have prenatal exams every 2 weeks until week 36. Then you will have weekly prenatal exams. During a routine prenatal visit:  You will be weighed to make sure you and the baby are growing normally.  Your blood pressure will be taken.  Your abdomen will be measured to track your baby's growth.  The fetal heartbeat will be listened to.  Any test results from the previous visit will be discussed.  You may have a cervical check near your due date to see if your cervix has softened or thinned (effaced).  You will be tested for Group B streptococcus. This happens between 35 and 37 weeks.  Your health care provider may ask you:  What your birth plan is.  How you are feeling.  If you are feeling the baby move.  If you have had   any abnormal symptoms, such as leaking fluid, bleeding, severe headaches, or abdominal cramping.  If you are using any tobacco products, including cigarettes, chewing tobacco, and electronic cigarettes.  If you have any questions.  Other tests or screenings that may be performed during your third trimester include:  Blood tests that check for low iron levels (anemia).  Fetal testing to check the health, activity level, and growth of the fetus. Testing is done if you have certain medical conditions or if there are problems during the  pregnancy.  Nonstress test (NST). This test checks the health of your baby to make sure there are no signs of problems, such as the baby not getting enough oxygen. During this test, a belt is placed around your belly. The baby is made to move, and its heart rate is monitored during movement.  What is false labor? False labor is a condition in which you feel small, irregular tightenings of the muscles in the womb (contractions) that usually go away with rest, changing position, or drinking water. These are called Braxton Hicks contractions. Contractions may last for hours, days, or even weeks before true labor sets in. If contractions come at regular intervals, become more frequent, increase in intensity, or become painful, you should see your health care provider. What are the signs of labor?  Abdominal cramps.  Regular contractions that start at 10 minutes apart and become stronger and more frequent with time.  Contractions that start on the top of the uterus and spread down to the lower abdomen and back.  Increased pelvic pressure and dull back pain.  A watery or bloody mucus discharge that comes from the vagina.  Leaking of amniotic fluid. This is also known as your "water breaking." It could be a slow trickle or a gush. Let your health care provider know if it has a color or strange odor. If you have any of these signs, call your health care provider right away, even if it is before your due date. Follow these instructions at home: Medicines  Follow your health care provider's instructions regarding medicine use. Specific medicines may be either safe or unsafe to take during pregnancy.  Take a prenatal vitamin that contains at least 600 micrograms (mcg) of folic acid.  If you develop constipation, try taking a stool softener if your health care provider approves. Eating and drinking  Eat a balanced diet that includes fresh fruits and vegetables, whole grains, good sources of protein  such as meat, eggs, or tofu, and low-fat dairy. Your health care provider will help you determine the amount of weight gain that is right for you.  Avoid raw meat and uncooked cheese. These carry germs that can cause birth defects in the baby.  If you have low calcium intake from food, talk to your health care provider about whether you should take a daily calcium supplement.  Eat four or five small meals rather than three large meals a day.  Limit foods that are high in fat and processed sugars, such as fried and sweet foods.  To prevent constipation: ? Drink enough fluid to keep your urine clear or pale yellow. ? Eat foods that are high in fiber, such as fresh fruits and vegetables, whole grains, and beans. Activity  Exercise only as directed by your health care provider. Most women can continue their usual exercise routine during pregnancy. Try to exercise for 30 minutes at least 5 days a week. Stop exercising if you experience uterine contractions.  Avoid heavy   lifting.  Do not exercise in extreme heat or humidity, or at high altitudes.  Wear low-heel, comfortable shoes.  Practice good posture.  You may continue to have sex unless your health care provider tells you otherwise. Relieving pain and discomfort  Take frequent breaks and rest with your legs elevated if you have leg cramps or low back pain.  Take warm sitz baths to soothe any pain or discomfort caused by hemorrhoids. Use hemorrhoid cream if your health care provider approves.  Wear a good support bra to prevent discomfort from breast tenderness.  If you develop varicose veins: ? Wear support pantyhose or compression stockings as told by your healthcare provider. ? Elevate your feet for 15 minutes, 3-4 times a day. Prenatal care  Write down your questions. Take them to your prenatal visits.  Keep all your prenatal visits as told by your health care provider. This is important. Safety  Wear your seat belt at  all times when driving.  Make a list of emergency phone numbers, including numbers for family, friends, the hospital, and police and fire departments. General instructions  Avoid cat litter boxes and soil used by cats. These carry germs that can cause birth defects in the baby. If you have a cat, ask someone to clean the litter box for you.  Do not travel far distances unless it is absolutely necessary and only with the approval of your health care provider.  Do not use hot tubs, steam rooms, or saunas.  Do not drink alcohol.  Do not use any products that contain nicotine or tobacco, such as cigarettes and e-cigarettes. If you need help quitting, ask your health care provider.  Do not use any medicinal herbs or unprescribed drugs. These chemicals affect the formation and growth of the baby.  Do not douche or use tampons or scented sanitary pads.  Do not cross your legs for long periods of time.  To prepare for the arrival of your baby: ? Take prenatal classes to understand, practice, and ask questions about labor and delivery. ? Make a trial run to the hospital. ? Visit the hospital and tour the maternity area. ? Arrange for maternity or paternity leave through employers. ? Arrange for family and friends to take care of pets while you are in the hospital. ? Purchase a rear-facing car seat and make sure you know how to install it in your car. ? Pack your hospital bag. ? Prepare the baby's nursery. Make sure to remove all pillows and stuffed animals from the baby's crib to prevent suffocation.  Visit your dentist if you have not gone during your pregnancy. Use a soft toothbrush to brush your teeth and be gentle when you floss. Contact a health care provider if:  You are unsure if you are in labor or if your water has broken.  You become dizzy.  You have mild pelvic cramps, pelvic pressure, or nagging pain in your abdominal area.  You have lower back pain.  You have persistent  nausea, vomiting, or diarrhea.  You have an unusual or bad smelling vaginal discharge.  You have pain when you urinate. Get help right away if:  Your water breaks before 37 weeks.  You have regular contractions less than 5 minutes apart before 37 weeks.  You have a fever.  You are leaking fluid from your vagina.  You have spotting or bleeding from your vagina.  You have severe abdominal pain or cramping.  You have rapid weight loss or weight gain.    You have shortness of breath with chest pain.  You notice sudden or extreme swelling of your face, hands, ankles, feet, or legs.  Your baby makes fewer than 10 movements in 2 hours.  You have severe headaches that do not go away when you take medicine.  You have vision changes. Summary  The third trimester is from week 28 through week 40, months 7 through 9. The third trimester is a time when the unborn baby (fetus) is growing rapidly.  During the third trimester, your discomfort may increase as you and your baby continue to gain weight. You may have abdominal, leg, and back pain, sleeping problems, and an increased need to urinate.  During the third trimester your breasts will keep growing and they will continue to become tender. A yellow fluid (colostrum) may leak from your breasts. This is the first milk you are producing for your baby.  False labor is a condition in which you feel small, irregular tightenings of the muscles in the womb (contractions) that eventually go away. These are called Braxton Hicks contractions. Contractions may last for hours, days, or even weeks before true labor sets in.  Signs of labor can include: abdominal cramps; regular contractions that start at 10 minutes apart and become stronger and more frequent with time; watery or bloody mucus discharge that comes from the vagina; increased pelvic pressure and dull back pain; and leaking of amniotic fluid. This information is not intended to replace advice  given to you by your health care provider. Make sure you discuss any questions you have with your health care provider. Document Released: 07/14/2001 Document Revised: 12/26/2015 Document Reviewed: 09/20/2012 Elsevier Interactive Patient Education  2017 [employer]  

## 2017-11-08 ENCOUNTER — Encounter: Payer: Self-pay | Admitting: Maternal Newborn

## 2017-11-08 ENCOUNTER — Ambulatory Visit (INDEPENDENT_AMBULATORY_CARE_PROVIDER_SITE_OTHER): Payer: BLUE CROSS/BLUE SHIELD | Admitting: Maternal Newborn

## 2017-11-08 VITALS — BP 118/80 | Wt 192.0 lb

## 2017-11-08 DIAGNOSIS — Z3493 Encounter for supervision of normal pregnancy, unspecified, third trimester: Secondary | ICD-10-CM

## 2017-11-08 DIAGNOSIS — Z3A32 32 weeks gestation of pregnancy: Secondary | ICD-10-CM

## 2017-11-08 NOTE — Progress Notes (Signed)
Routine Prenatal Care Visit  Subjective  Sierra Clay is a 27 y.o. G3P1011 at 2240w2d being seen today for ongoing prenatal care.  She is currently monitored for the following issues for this low-risk pregnancy and has Left lower quadrant pain and Supervision of low-risk pregnancy on their problem list.  ----------------------------------------------------------------------------------- Patient reports occasional contractions.  They occur when she is working. During the last episode they were as frequent as 5-10 minutes apart. They resolve with time. Contractions: Not present. Vag. Bleeding: None.  Movement: Present. Denies leaking of fluid.  ----------------------------------------------------------------------------------- The following portions of the patient's history were reviewed and updated as appropriate: allergies, current medications, past family history, past medical history, past social history, past surgical history and problem list. Problem list updated.   Objective  Blood pressure 118/80, weight 192 lb (87.1 kg). Pregravid weight 152 lb (68.9 kg) Total Weight Gain 40 lb (18.1 kg) Urinalysis: Urine Protein: Negative Urine Glucose: Negative  Fetal Status: Fetal Heart Rate (bpm): 140 Fundal Height: 33 cm Movement: Present     General:  Alert, oriented and cooperative. Patient is in no acute distress.  Skin: Skin is warm and dry. No rash noted.   Cardiovascular: Normal heart rate noted  Respiratory: Normal respiratory effort, no problems with respiration noted  Abdomen: Soft, gravid, appropriate for gestational age. Pain/Pressure: Present     Pelvic:  Cervical exam deferred        Extremities: Normal range of motion.     Mental Status: Normal mood and affect. Normal behavior. Normal judgment and thought content.     Assessment   27 y.o. G3P1011 at 6240w2d, EDD 01/01/2018 by Ultrasound presenting for routine prenatal visit.  Plan   pregnancy Problems (from 05/20/17 to  present)    Problem Noted Resolved   Supervision of low-risk pregnancy 05/20/2017 by Conard NovakJackson, Stephen D, MD No   Overview Addendum 10/14/2017 11:40 AM by Vena AustriaStaebler, Andreas, MD      [facility]inic [facility] Prenatal Labs  Dating  T1 US Blood type: O/Positive/-- (11/15 1054)   Genetic Screen Declines Antibody:Negative (11/15 1054)  Anatomic US Complete 09/13/2017 Rubella: 20.40 (11/15 1054) Varicella: Immune  GTT 128 RPR: Non Reactive (11/15 1054)   Rhogam  HBsAg: Negative (11/15 1054)   TDaP vaccine                       HIV:   Non Reactive  Flu Shot      declined                          GBS:   Contraception       IUD Pap: 05/14/2017, NILM  CBB     CS/VBAC    Baby Food      Breast   Support Person             Low-lying placenta 08/02/2017 by Natale MilchSchuman, Christanna R, MD 10/11/2017 by Nadara MustardHarris, Robert P, MD   Overview Signed 09/13/2017 10:46 AM by Vena AustriaStaebler, Andreas, MD    Resolved 2.5cm at 24 weeks       Discussed rest and hydration and trying to avoid activities at work that are causing contractions.   Preterm labor symptoms and general obstetric precautions including but not limited to vaginal bleeding, contractions, leaking of fluid and fetal movement were reviewed in detail with the patient.   Return in about 2 weeks (around 11/22/2017) for ROB.  Marcelyn BruinsJacelyn Schmid, CNM 11/08/2017  9:23  AM

## 2017-11-22 ENCOUNTER — Encounter: Payer: Self-pay | Admitting: Maternal Newborn

## 2017-11-22 ENCOUNTER — Ambulatory Visit (INDEPENDENT_AMBULATORY_CARE_PROVIDER_SITE_OTHER): Payer: BLUE CROSS/BLUE SHIELD | Admitting: Maternal Newborn

## 2017-11-22 VITALS — BP 130/70 | Wt 196.0 lb

## 2017-11-22 DIAGNOSIS — Z3A34 34 weeks gestation of pregnancy: Secondary | ICD-10-CM

## 2017-11-22 DIAGNOSIS — Z3493 Encounter for supervision of normal pregnancy, unspecified, third trimester: Secondary | ICD-10-CM

## 2017-11-22 NOTE — Progress Notes (Signed)
    Routine Prenatal Care Visit  Subjective  Sierra Clay is a 27 y.o. G3P1011 at 2759w2d being seen today for ongoing prenatal care.  She is currently monitored for the following issues for this low-risk pregnancy and has Supervision of low-risk pregnancy on their problem list.  ----------------------------------------------------------------------------------- Patient reports no complaints.   Contractions: Not present. Vag. Bleeding: None.  Movement: Present. Denies leaking of fluid.  ----------------------------------------------------------------------------------- The following portions of the patient's history were reviewed and updated as appropriate: allergies, current medications, past family history, past medical history, past social history, past surgical history and problem list. Problem list updated.   Objective  Blood pressure 130/70, weight 196 lb (88.9 kg). Pregravid weight 152 lb (68.9 kg) Total Weight Gain 44 lb (20 kg) Urinalysis: Urine Protein: Negative Urine Glucose: Negative  Fetal Status: Fetal Heart Rate (bpm): 142 Fundal Height: 36 cm Movement: Present     General:  Alert, oriented and cooperative. Patient is in no acute distress.  Skin: Skin is warm and dry. No rash noted.   Cardiovascular: Normal heart rate noted  Respiratory: Normal respiratory effort, no problems with respiration noted  Abdomen: Soft, gravid, appropriate for gestational age. Pain/Pressure: Absent     Pelvic:  Cervical exam deferred        Extremities: Normal range of motion.  Edema: None  Mental Status: Normal mood and affect. Normal behavior. Normal judgment and thought content.     Assessment   27 y.o. G3P1011 at 2059w2d, EDD 01/01/2018 by Ultrasound presenting for routine prenatal visit.  Plan   pregnancy Problems (from 05/20/17 to present)    Problem Noted Resolved   Supervision of low-risk pregnancy 05/20/2017 by Conard NovakJackson, Stephen D, MD No   Overview Addendum 10/14/2017 11:40 AM by  Vena AustriaStaebler, Andreas, MD      [facility]inic [facility] Prenatal Labs  Dating  T1 US Blood type: O/Positive/-- (11/15 1054)   Genetic Screen Declines Antibody:Negative (11/15 1054)  Anatomic US Complete 09/13/2017 Rubella: 20.40 (11/15 1054) Varicella: Immune  GTT 128 RPR: Non Reactive (11/15 1054)   Rhogam  HBsAg: Negative (11/15 1054)   TDaP vaccine                       HIV:   Non Reactive  Flu Shot      declined                          GBS:   Contraception       IUD Pap: 05/14/2017, NILM  CBB     CS/VBAC    Baby Food      Breast   Support Person             Low-lying placenta 08/02/2017 by Natale MilchSchuman, Christanna R, MD 10/11/2017 by Nadara MustardHarris, Robert P, MD   Overview Signed 09/13/2017 10:46 AM by Vena AustriaStaebler, Andreas, MD    Resolved 2.5cm at 24 weeks         Preterm labor symptoms and general obstetric precautions including but not limited to vaginal bleeding, contractions, leaking of fluid and fetal movement were reviewed.  Return in about 2 weeks (around 12/06/2017) for ROB.  Marcelyn BruinsJacelyn Cassandria Clay, CNM 11/22/2017  10:36 AM

## 2017-11-22 NOTE — Progress Notes (Signed)
No concerns.rj 

## 2017-11-23 ENCOUNTER — Telehealth: Payer: Self-pay

## 2017-11-23 NOTE — Telephone Encounter (Signed)
FMLA/DISABILITY form for [employer] filled out, signature obtained, and given to [place] for processing. 

## 2017-12-06 ENCOUNTER — Ambulatory Visit (INDEPENDENT_AMBULATORY_CARE_PROVIDER_SITE_OTHER): Payer: BLUE CROSS/BLUE SHIELD | Admitting: Certified Nurse Midwife

## 2017-12-06 VITALS — BP 120/80 | Wt 196.0 lb

## 2017-12-06 DIAGNOSIS — Z3685 Encounter for antenatal screening for Streptococcus B: Secondary | ICD-10-CM

## 2017-12-06 DIAGNOSIS — Z3493 Encounter for supervision of normal pregnancy, unspecified, third trimester: Secondary | ICD-10-CM

## 2017-12-06 DIAGNOSIS — Z3A36 36 weeks gestation of pregnancy: Secondary | ICD-10-CM

## 2017-12-06 NOTE — Progress Notes (Signed)
Pt c/o swelling in legs and feet. GBS/aptima today.

## 2017-12-08 LAB — STREP GP B NAA: Strep Gp B NAA: NEGATIVE

## 2017-12-13 ENCOUNTER — Ambulatory Visit (INDEPENDENT_AMBULATORY_CARE_PROVIDER_SITE_OTHER): Payer: BLUE CROSS/BLUE SHIELD | Admitting: Certified Nurse Midwife

## 2017-12-13 VITALS — BP 110/70 | Wt 198.0 lb

## 2017-12-13 DIAGNOSIS — Z3A37 37 weeks gestation of pregnancy: Secondary | ICD-10-CM

## 2017-12-13 DIAGNOSIS — Z3493 Encounter for supervision of normal pregnancy, unspecified, third trimester: Secondary | ICD-10-CM

## 2017-12-13 NOTE — Progress Notes (Signed)
ROB at 37wk2d: Complains of contractions during the night (0300)/ having mucoid discharge/ no VB. Baby active GBS negative Cervix: 0.5 cm/-3 station Nitrazine negative. Labor precautions ROB 1 week Farrel Conners, CNM

## 2017-12-13 NOTE — Progress Notes (Signed)
Pt c/o back pain and feels like she may be leaking fluid.

## 2017-12-16 NOTE — Telephone Encounter (Signed)
Pt calling regarding FMLA paperwork and states that her job never received paperwork. Do you know anything about this?

## 2017-12-20 ENCOUNTER — Ambulatory Visit (INDEPENDENT_AMBULATORY_CARE_PROVIDER_SITE_OTHER): Payer: BLUE CROSS/BLUE SHIELD | Admitting: Obstetrics and Gynecology

## 2017-12-20 ENCOUNTER — Encounter: Payer: Self-pay | Admitting: Obstetrics and Gynecology

## 2017-12-20 VITALS — BP 110/70 | Wt 196.0 lb

## 2017-12-20 DIAGNOSIS — Z3493 Encounter for supervision of normal pregnancy, unspecified, third trimester: Secondary | ICD-10-CM

## 2017-12-20 DIAGNOSIS — Z3A38 38 weeks gestation of pregnancy: Secondary | ICD-10-CM

## 2017-12-20 NOTE — Progress Notes (Signed)
Pt c/o severe lower back pain. Feels like she may have lost her mucous plug. Desires cervical check.

## 2017-12-20 NOTE — Patient Instructions (Signed)

## 2017-12-20 NOTE — Progress Notes (Signed)
    Routine Prenatal Care Visit  Subjective  Sierra Clay is a 27 y.o. G3P1011 at [redacted]w[redacted]d being seen today for ongoing prenatal care.  She is currently monitored for the following issues for this low-risk pregnancy and has Supervision of low-risk pregnancy on their problem list.  ----------------------------------------------------------------------------------- Patient reports no complaints.   Contractions: Irregular. Vag. Bleeding: None.  Movement: Present. Denies leaking of fluid.  ----------------------------------------------------------------------------------- The following portions of the patient's history were reviewed and updated as appropriate: allergies, current medications, past family history, past medical history, past social history, past surgical history and problem list. Problem list updated.   Objective  Blood pressure 110/70, weight 196 lb (88.9 kg). Pregravid weight 152 lb (68.9 kg) Total Weight Gain 44 lb (20 kg) Urinalysis: Urine Protein: Trace Urine Glucose: Negative  Fetal Status: Fetal Heart Rate (bpm): 142 Fundal Height: 39 cm Movement: Present     General:  Alert, oriented and cooperative. Patient is in no acute distress.  Skin: Skin is warm and dry. No rash noted.   Cardiovascular: Normal heart rate noted  Respiratory: Normal respiratory effort, no problems with respiration noted  Abdomen: Soft, gravid, appropriate for gestational age. Pain/Pressure: Present     Pelvic:  Cervical exam performed Dilation: 1 Effacement (%): 60 Station: -3  Extremities: Normal range of motion.     ental Status: Normal mood and affect. Normal behavior. Normal judgment and thought content.     Assessment   27 y.o. G3P1011 at [redacted]w[redacted]d by  01/01/2018, by Ultrasound presenting for routine prenatal visit  Plan   pregnancy Problems (from 05/20/17 to present)    Problem Noted Resolved   Supervision of low-risk pregnancy 05/20/2017 by Conard Novak, MD No   Overview Addendum  12/20/2017 11:48 AM by Natale Milch, MD      [facility] Prenatal Labs  Dating  T1 [place] Blood type: O/Positive/-- (11/15 1054)   Genetic Screen Declines Antibody:Negative (11/15 1054)  Anatomic [place] Complete 09/13/2017 Rubella: 20.40 (11/15 1054) Varicella: Immune  GTT 128 RPR: Non Reactive (11/15 1054)   Rhogam  not applicable HBsAg: Negative (11/15 1054)   TDaP vaccine 10/25/17 HIV:   Non Reactive  Flu Shot      declined                          GBS: negative on 5/6  Contraception       IUD Pap: 05/14/2017, NILM  CBB  Given information   CS/VBAC Not applicable   Baby Food      Breast   Support Person             Low-lying placenta 08/02/2017 by Natale Milch, MD 10/11/2017 by Nadara Mustard, MD   Overview Signed 09/13/2017 10:46 AM by Vena Austria, MD    Resolved 2.5cm at [redacted] weeks          Gestational age appropriate obstetric precautions including but not limited to vaginal bleeding, contractions, leaking of fluid and fetal movement were reviewed in detail with the patient.    Membranes swept at patient's request. Cord blood banking discussed.  Return in about 1 week (around 12/27/2017) for ROB.  Adelene Idler MD [facility], [facility] 12/20/17 12:07 PM

## 2017-12-21 ENCOUNTER — Inpatient Hospital Stay
Admission: EM | Admit: 2017-12-21 | Discharge: 2017-12-22 | DRG: 833 | Disposition: A | Discharge status: Home / Self Care | Payer: BLUE CROSS/BLUE SHIELD | Attending: Obstetrics and Gynecology | Admitting: Obstetrics and Gynecology

## 2017-12-21 DIAGNOSIS — O471 False labor at or after 37 completed weeks of gestation: Secondary | ICD-10-CM | POA: Diagnosis not present

## 2017-12-21 DIAGNOSIS — Z3A38 38 weeks gestation of pregnancy: Secondary | ICD-10-CM

## 2017-12-21 DIAGNOSIS — O479 False labor, unspecified: Secondary | ICD-10-CM | POA: Diagnosis present

## 2017-12-21 DIAGNOSIS — Z3493 Encounter for supervision of normal pregnancy, unspecified, third trimester: Secondary | ICD-10-CM

## 2017-12-21 MED ORDER — SOD CITRATE-CITRIC ACID 500-334 MG/5ML PO SOLN: 30.0000 mL | ORAL | Status: DC | PRN

## 2017-12-21 MED ORDER — OXYTOCIN BOLUS FROM INFUSION: 500.0000 mL | Freq: Once | INTRAVENOUS | Status: DC

## 2017-12-21 MED ORDER — LIDOCAINE HCL (PF) 1 % IJ SOLN: 30.0000 mL | INTRAMUSCULAR | Status: DC | PRN

## 2017-12-21 MED ORDER — ACETAMINOPHEN 325 MG PO TABS: 650.0000 mg | ORAL_TABLET | ORAL | Status: DC | PRN

## 2017-12-21 MED ORDER — OXYTOCIN 40 UNITS IN LACTATED RINGERS INFUSION - SIMPLE MED
2.5000 [IU]/h | INTRAVENOUS | Status: DC
  Filled 2017-12-21: qty 1000

## 2017-12-21 MED ORDER — ONDANSETRON HCL 4 MG/2ML IJ SOLN: 4.0000 mg | Freq: Four times a day (QID) | INTRAMUSCULAR | Status: DC | PRN

## 2017-12-21 MED ORDER — LACTATED RINGERS IV SOLN
INTRAVENOUS | Status: DC
  Administered 2017-12-22: 01:00:00 via INTRAVENOUS

## 2017-12-21 MED ORDER — BUTORPHANOL TARTRATE 2 MG/ML IJ SOLN: 1.0000 mg | INTRAMUSCULAR | Status: DC | PRN

## 2017-12-21 MED ORDER — LACTATED RINGERS IV SOLN: 500.0000 mL | INTRAVENOUS | Status: DC | PRN

## 2017-12-21 NOTE — Progress Notes (Signed)
ROB at [facility]: Ongoing problems with pelvic pain and irregular contractions. Baby active Complains of some vaginal discharge with itching. Wet prep negative for hyphae, Trich, clue cells Cervix: FT/long/-2 GBS done ROB in 1 week Labor precautions Farrel Conners, CNM

## 2017-12-21 NOTE — OB Triage Note (Signed)
Patient presents with c/o contractions since 1950. Denies any vaginal bleeding/spotting. efm and toco applied fhr-150.  Abdomen soft non-tender. sve-1.5/60/-3  Dr. Bonney Aid notified of patient arrival and assessment, reviewed strip

## 2017-12-21 NOTE — H&P (Signed)
Obstetric H&P   Chief Complaint:Contractions  Prenatal Care Provider: [facility]  History of Present Illness: 27 y.o. G3P1011 [redacted]w[redacted]d by 01/01/2018, by Ultrasound presenting to L&D with contractions.  No LOF, no VB, +FM  Patient underwent 2-hr rule out and made change from 1.5cm to 3cm.  Pregnancy uncomplicated last delivery 7 years ago.    Pregravid weight 152 lb (68.9 kg) Total Weight Gain 44 lb (20 kg)  pregnancy Problems (from 05/20/17 to present)    Problem Noted Resolved   Supervision of low-risk pregnancy 05/20/2017 by Conard Novak, MD No   Overview Addendum 12/20/2017 11:48 AM by Natale Milch, MD      [facility]inic [facility] Prenatal Labs  Dating  T1 [place] Blood type: O/Positive/-- (11/15 1054)   Genetic Screen Declines Antibody:Negative (11/15 1054)  Anatomic [place] Complete 09/13/2017 Rubella: 20.40 (11/15 1054) Varicella: Immune  GTT 128 RPR: Non Reactive (11/15 1054)   Rhogam  not applicable HBsAg: Negative (11/15 1054)   TDaP vaccine 10/25/17 HIV:   Non Reactive  Flu Shot      declined                          GBS: negative on 5/6  Contraception       IUD Pap: 05/14/2017, NILM  CBB  Given information   CS/VBAC Not applicable   Baby Food      Breast   Support Person             Low-lying placenta 08/02/2017 by Natale Milch, MD 10/11/2017 by Nadara Mustard, MD   Overview Signed 09/13/2017 10:46 AM by Vena Austria, MD    Resolved 2.5cm at 24 weeks          Review of Systems: 10 point review of systems negative unless otherwise noted in HPI  Past Medical History: Past Medical History:  Diagnosis Date  . Asthma    mild  . History of abnormal cervical Pap smear 2012, 2013, 2016    Past Surgical History: Past Surgical History:  Procedure Laterality Date  . NO PAST SURGERIES      Past Obstetric History: #: 1, Date: None, Sex: None, Weight: None, GA: None, Delivery: None, Apgar1: None, Apgar5: None, Living: None, Birth Comments: None  #: 2, Date:  01[birth year]05[birth year]10, Sex: Female, Weight: 7 lb 2 oz (3.232 kg), GA: None, Delivery: Vaginal, Spontaneous, Apgar1: None, Apgar5: None, Living: Living, Birth Comments: None  #: 3, Date: None, Sex: None, Weight: None, GA: None, Delivery: None, Apgar1: None, Apgar5: None, Living: None, Birth Comments: None  Family History: Family History  Problem Relation Age of Onset  . Diabetes Paternal Grandmother     Social History: Social History   Socioeconomic History  . Marital status: Single    Spouse name: Not on file  . Number of children: Not on file  . Years of education: Not on file  . Highest education level: Not on file  Occupational History  . Not on file  Social Needs  . Financial resource strain: Not on file  . Food insecurity:    Worry: Not on file    Inability: Not on file  . Transportation needs:    Medical: Not on file    Non-medical: Not on file  Tobacco Use  . Smoking status: Never Smoker  . Smokeless tobacco: Never Used  Substance and Sexual Activity  . Alcohol use: No  . Drug use: No  . Sexual activity: Yes  Birth control/protection: None  Lifestyle  . Physical activity:    Days per week: Not on file    Minutes per session: Not on file  . Stress: Not on file  Relationships  . Social connections:    Talks on phone: Not on file    Gets together: Not on file    Attends religious service: Not on file    Active member of club or organization: Not on file    Attends meetings of clubs or organizations: Not on file    Relationship status: Not on file  . Intimate partner violence:    Fear of current or ex partner: Not on file    Emotionally abused: Not on file    Physically abused: Not on file    Forced sexual activity: Not on file  Other Topics Concern  . Not on file  Social History Narrative  . Not on file    Medications: Prior to Admission medications   Not on File    Allergies: No Known Allergies  Physical Exam: Vitals: There were no vitals taken for this  visit.  FHT: 130, moderate, +accels, no decels Toco: q70min  General: NAD HEENT: normocephalic, anicteric Pulmonary: No increased work of breathing Cardiovascular: RRR, distal pulses 2+ Abdomen: Gravid, non-tender Leopolds: 7lbs Genitourinary:  3-3.5/80/-2 Extremities: no edema, erythema, or tenderness Neurologic: Grossly intact Psychiatric: mood appropriate, affect full  Labs: No results found for this or any previous visit (from the past 24 hour(s)).  Assessment: 27 y.o. G3P1011 [redacted]w[redacted]d by 01/01/2018, by Ultrasound presenting in term labor  Plan: 1) Labor - cervical change 1.5 cm to 3cm over 2-hrs admit for term labor  2) Fetus - cat I  3) PNL - Blood type O/Positive/-- (11/15 1054) / Anti-bodyscreen Negative (11/15 1054) / Rubella 20.40 (11/15 1054) / Varicella Immune / RPR Non Reactive (03/11 1105) / HBsAg Negative (11/15 1054) / HIV Non Reactive (03/11 1105) / 1-hr OGTT 128 / GBS Negative (05/06 1425)  4) Immunization History -  Immunization History  Administered Date(s) Administered  . Tdap 10/25/2017    5) Disposition - pending delivery  Vena Austria, MD, [facility], [facility] 12/21/2017, 11:11 PM

## 2017-12-22 ENCOUNTER — Other Ambulatory Visit: Payer: Self-pay

## 2017-12-22 DIAGNOSIS — O471 False labor at or after 37 completed weeks of gestation: Secondary | ICD-10-CM | POA: Diagnosis not present

## 2017-12-22 DIAGNOSIS — Z3A38 38 weeks gestation of pregnancy: Secondary | ICD-10-CM | POA: Diagnosis not present

## 2017-12-22 LAB — CBC
HEMATOCRIT: 35.4 % (ref 35.0–47.0)
HEMOGLOBIN: 12.2 g/dL (ref 12.0–16.0)
MCH: 30.2 pg (ref 26.0–34.0)
MCHC: 34.5 g/dL (ref 32.0–36.0)
MCV: 87.7 fL (ref 80.0–100.0)
PLATELETS: 218 10*3/uL (ref 150–440)
RBC: 4.04 MIL/uL (ref 3.80–5.20)
RDW: 14.6 % — ABNORMAL HIGH (ref 11.5–14.5)
WBC: 12.7 10*3/uL — ABNORMAL HIGH (ref 3.6–11.0)

## 2017-12-22 LAB — TYPE AND SCREEN
ABO/RH(D): O POS
Antibody Screen: NEGATIVE

## 2017-12-22 MED ORDER — LIDOCAINE HCL (PF) 1 % IJ SOLN
INTRAMUSCULAR | Status: AC
  Filled 2017-12-22: qty 30

## 2017-12-22 MED ORDER — OXYTOCIN 10 UNIT/ML IJ SOLN
INTRAMUSCULAR | Status: AC
  Filled 2017-12-22: qty 2

## 2017-12-22 MED ORDER — AMMONIA AROMATIC IN INHA
RESPIRATORY_TRACT | Status: AC
  Filled 2017-12-22: qty 10

## 2017-12-22 MED ORDER — MISOPROSTOL 200 MCG PO TABS
ORAL_TABLET | ORAL | Status: AC
  Filled 2017-12-22: qty 4

## 2017-12-22 NOTE — Plan of Care (Signed)
Discharge instructions, both oral and written, given to pt and family members. Labor precautions discussed at length. Pt understands timing of contractions and fetal kick counts.  Pt ready to leave dept in stable condition ambulatory with family members. To return if labor contractions increase in intensity or pt is scheduled for IOL Saturday at 8 am if not before then.  Pt ag rees with plan of care. Ellison Carwin RNC

## 2017-12-22 NOTE — Discharge Summary (Signed)
See final progress note. 

## 2017-12-22 NOTE — Final Progress Note (Signed)
Physician Final Progress Note  Patient ID: Sierra Clay MRN: [id number] DOB/AGE: 01-May-1991 27 y.o.  Admit date: 12/21/2017 Admitting provider: Vena Austria, MD Discharge date: 12/22/2017   Admission Diagnoses: false labor Discharge Diagnoses:  Active Problems:   Irregular uterine contractions   Normal labor   27 y.o. G3P1011 at [redacted]w[redacted]d presenting for contractions, initially made good cervical change and then stopped.  Given less than 39 weeks discharged IOL scheduled for 39 weeks.  Consults: None  Significant Findings/ Diagnostic Studies:  Results for orders placed or performed during the hospital encounter of 12/21/17 (from the past 24 hour(s))  CBC     Status: Abnormal   Collection Time: 12/21/17 11:10 PM  Result Value Ref Range   WBC 12.7 (H) 3.6 - 11.0 K/uL   RBC 4.04 3.80 - 5.20 MIL/uL   Hemoglobin 12.2 12.0 - 16.0 g/dL   HCT 78.2 95.6 - 21.3 %   MCV 87.7 80.0 - 100.0 fL   MCH 30.2 26.0 - 34.0 pg   MCHC 34.5 32.0 - 36.0 g/dL   RDW 08.6 (H) 57.8 - 46.9 %   Platelets 218 150 - 440 K/uL  Type and screen [facility]     Status: None   Collection Time: 12/21/17 11:10 PM  Result Value Ref Range   ABO/RH(D) O POS    Antibody Screen NEG    Sample Expiration      12/24/2017 Performed at [facility], [address] [place], [place] [address]      Procedures: noen  Discharge Condition: good  Disposition: Discharge disposition: 01-Home or Self Care       Diet: Regular diet  Discharge Activity: Activity as tolerated  Discharge Instructions    Discharge activity:  No Restrictions   Complete by:  As directed    Discharge diet:  No restrictions   Complete by:  As directed    Fetal Kick Count:  Lie on our left side for one hour after a meal, and count the number of times your baby kicks.  If it is less than 5 times, get up, move around and drink some juice.  Repeat the test 30 minutes later.  If it is still less than 5 kicks  in an hour, notify your doctor.   Complete by:  As directed    LABOR:  When conractions begin, you should start to time them from the beginning of one contraction to the beginning  of the next.  When contractions are 5 - 10 minutes apart or less and have been regular for at least an hour, you should call your health care provider.   Complete by:  As directed    No sexual activity restrictions   Complete by:  As directed    Notify physician for bleeding from the vagina   Complete by:  As directed    Notify physician for blurring of vision or spots before the eyes   Complete by:  As directed    Notify physician for chills or fever   Complete by:  As directed    Notify physician for fainting spells, "black outs" or loss of consciousness   Complete by:  As directed    Notify physician for increase in vaginal discharge   Complete by:  As directed    Notify physician for leaking of fluid   Complete by:  As directed    Notify physician for pain or burning when urinating   Complete by:  As directed  Notify physician for pelvic pressure (sudden increase)   Complete by:  As directed    Notify physician for severe or continued nausea or vomiting   Complete by:  As directed    Notify physician for sudden gushing of fluid from the vagina (with or without continued leaking)   Complete by:  As directed    Notify physician for sudden, constant, or occasional abdominal pain   Complete by:  As directed    Notify physician if baby moving less than usual   Complete by:  As directed      Allergies as of 12/22/2017   No Known Allergies     Medication List    You have not been prescribed any medications.      Total time spent taking care of this patient: 30 minutes  Signed: Vena Austria 12/22/2017, 8:38 AM

## 2017-12-22 NOTE — Discharge Instructions (Signed)
Call or return if any labor signs increase. Discussed at length per MD and RN labor precautions.  Pt scheduled per MD for IOL Saturday at 8 am if not delivered prior. Pt and husband agree with plan of care. Pt ready to leave dept in stable condition ambulatory with family.   Ellison Carwin RNC

## 2017-12-23 LAB — RPR: RPR: NONREACTIVE

## 2017-12-25 ENCOUNTER — Inpatient Hospital Stay
Admission: RE | Admit: 2017-12-25 | Discharge: 2017-12-28 | DRG: 768 | Disposition: A | Discharge status: Home / Self Care | Payer: BLUE CROSS/BLUE SHIELD | Source: Ambulatory Visit | Attending: Obstetrics and Gynecology | Admitting: Obstetrics and Gynecology

## 2017-12-25 ENCOUNTER — Inpatient Hospital Stay: Payer: BLUE CROSS/BLUE SHIELD | Admitting: Anesthesiology

## 2017-12-25 ENCOUNTER — Other Ambulatory Visit: Payer: Self-pay

## 2017-12-25 DIAGNOSIS — O411230 Chorioamnionitis, third trimester, not applicable or unspecified: Secondary | ICD-10-CM | POA: Diagnosis not present

## 2017-12-25 DIAGNOSIS — O9081 Anemia of the puerperium: Secondary | ICD-10-CM | POA: Diagnosis not present

## 2017-12-25 DIAGNOSIS — D62 Acute posthemorrhagic anemia: Secondary | ICD-10-CM | POA: Diagnosis not present

## 2017-12-25 DIAGNOSIS — Q541 Hypospadias, penile: Secondary | ICD-10-CM | POA: Diagnosis not present

## 2017-12-25 DIAGNOSIS — Z3A39 39 weeks gestation of pregnancy: Secondary | ICD-10-CM | POA: Diagnosis not present

## 2017-12-25 DIAGNOSIS — Z23 Encounter for immunization: Secondary | ICD-10-CM | POA: Diagnosis not present

## 2017-12-25 DIAGNOSIS — Z3483 Encounter for supervision of other normal pregnancy, third trimester: Secondary | ICD-10-CM | POA: Diagnosis not present

## 2017-12-25 LAB — CBC
HCT: 36.8 % (ref 35.0–47.0)
Hemoglobin: 12.3 g/dL (ref 12.0–16.0)
MCH: 29.6 pg (ref 26.0–34.0)
MCHC: 33.6 g/dL (ref 32.0–36.0)
MCV: 88.1 fL (ref 80.0–100.0)
Platelets: 222 10*3/uL (ref 150–440)
RBC: 4.17 MIL/uL (ref 3.80–5.20)
RDW: 14.6 % — ABNORMAL HIGH (ref 11.5–14.5)
WBC: 11.5 10*3/uL — ABNORMAL HIGH (ref 3.6–11.0)

## 2017-12-25 LAB — TYPE AND SCREEN
ABO/RH(D): O POS
Antibody Screen: NEGATIVE

## 2017-12-25 LAB — RAPID HIV SCREEN (HIV 1/2 AB+AG)
HIV 1/2 Antibodies: NONREACTIVE
HIV-1 P24 Antigen - HIV24: NONREACTIVE

## 2017-12-25 MED ORDER — LIDOCAINE-EPINEPHRINE (PF) 1.5 %-1:200000 IJ SOLN
INTRAMUSCULAR | Status: DC | PRN
  Administered 2017-12-25: 3 mL via PERINEURAL

## 2017-12-25 MED ORDER — FENTANYL 2.5 MCG/ML W/ROPIVACAINE 0.15% IN NS 100 ML EPIDURAL (ARMC)
12.0000 mL/h | EPIDURAL | Status: DC
  Administered 2017-12-25 – 2017-12-26 (×2): 12 mL/h via EPIDURAL
  Filled 2017-12-25 (×2): qty 100

## 2017-12-25 MED ORDER — LACTATED RINGERS IV SOLN
500.0000 mL | INTRAVENOUS | Status: DC | PRN
  Administered 2017-12-25 – 2017-12-26 (×2): 1000 mL via INTRAVENOUS
  Administered 2017-12-26 (×2): 250 mL via INTRAVENOUS

## 2017-12-25 MED ORDER — AMMONIA AROMATIC IN INHA
RESPIRATORY_TRACT | Status: AC
  Filled 2017-12-25: qty 10

## 2017-12-25 MED ORDER — OXYTOCIN 40 UNITS IN LACTATED RINGERS INFUSION - SIMPLE MED
1.0000 m[IU]/min | INTRAVENOUS | Status: DC
  Administered 2017-12-25: 2 m[IU]/min via INTRAVENOUS

## 2017-12-25 MED ORDER — BUPIVACAINE HCL (PF) 0.25 % IJ SOLN
INTRAMUSCULAR | Status: DC | PRN
  Administered 2017-12-25: 10 mL via EPIDURAL

## 2017-12-25 MED ORDER — LIDOCAINE HCL (PF) 1 % IJ SOLN
INTRAMUSCULAR | Status: AC
  Filled 2017-12-25: qty 30

## 2017-12-25 MED ORDER — TERBUTALINE SULFATE 1 MG/ML IJ SOLN: 0.2500 mg | Freq: Once | INTRAMUSCULAR | Status: DC | PRN

## 2017-12-25 MED ORDER — OXYTOCIN 10 UNIT/ML IJ SOLN
INTRAMUSCULAR | Status: AC
  Filled 2017-12-25: qty 2

## 2017-12-25 MED ORDER — OXYTOCIN 40 UNITS IN LACTATED RINGERS INFUSION - SIMPLE MED
2.5000 [IU]/h | INTRAVENOUS | Status: DC
  Administered 2017-12-26: 2.5 [IU]/h via INTRAVENOUS
  Filled 2017-12-25 (×2): qty 1000

## 2017-12-25 MED ORDER — DIPHENHYDRAMINE HCL 50 MG/ML IJ SOLN: 12.5000 mg | INTRAMUSCULAR | Status: DC | PRN

## 2017-12-25 MED ORDER — LIDOCAINE HCL (PF) 1 % IJ SOLN
INTRAMUSCULAR | Status: DC | PRN
  Administered 2017-12-25: 3 mL

## 2017-12-25 MED ORDER — EPHEDRINE 5 MG/ML INJ: 10.0000 mg | INTRAVENOUS | Status: DC | PRN

## 2017-12-25 MED ORDER — PHENYLEPHRINE 40 MCG/ML (10ML) SYRINGE FOR IV PUSH (FOR BLOOD PRESSURE SUPPORT)
80.0000 ug | PREFILLED_SYRINGE | INTRAVENOUS | Status: DC | PRN
  Filled 2017-12-25: qty 5

## 2017-12-25 MED ORDER — OXYTOCIN BOLUS FROM INFUSION
500.0000 mL | Freq: Once | INTRAVENOUS | Status: DC
  Administered 2017-12-26: 500 mL via INTRAVENOUS

## 2017-12-25 MED ORDER — SOD CITRATE-CITRIC ACID 500-334 MG/5ML PO SOLN: 30.0000 mL | ORAL | Status: DC | PRN

## 2017-12-25 MED ORDER — LIDOCAINE HCL (PF) 1 % IJ SOLN
30.0000 mL | INTRAMUSCULAR | Status: AC | PRN
  Administered 2017-12-26: 30 mL via SUBCUTANEOUS

## 2017-12-25 MED ORDER — FENTANYL 2.5 MCG/ML W/ROPIVACAINE 0.15% IN NS 100 ML EPIDURAL (ARMC)
EPIDURAL | Status: AC
  Filled 2017-12-25: qty 100

## 2017-12-25 MED ORDER — ONDANSETRON HCL 4 MG/2ML IJ SOLN
4.0000 mg | Freq: Four times a day (QID) | INTRAMUSCULAR | Status: DC | PRN
  Administered 2017-12-26: 4 mg via INTRAVENOUS
  Filled 2017-12-25: qty 2

## 2017-12-25 MED ORDER — LACTATED RINGERS IV SOLN: 500.0000 mL | Freq: Once | INTRAVENOUS | Status: DC

## 2017-12-25 MED ORDER — LACTATED RINGERS IV SOLN
INTRAVENOUS | Status: DC
  Administered 2017-12-25: 1000 mL via INTRAVENOUS
  Administered 2017-12-26 (×2): via INTRAVENOUS

## 2017-12-25 MED ORDER — MISOPROSTOL 200 MCG PO TABS
ORAL_TABLET | ORAL | Status: AC
  Filled 2017-12-25: qty 4

## 2017-12-25 MED ORDER — OXYTOCIN 40 UNITS IN LACTATED RINGERS INFUSION - SIMPLE MED
INTRAVENOUS | Status: AC
  Filled 2017-12-25: qty 1000

## 2017-12-25 NOTE — H&P (Signed)
Hisotry and Physical  Sierra Clay is an 27 y.o. female.  HPI: Patient presents today for induction of labor. Induction was scheduled by Dr. Bonney Aid as an elective induction for a patient with a favorable bishop score.  Patient is feeling well. No complaints.  Good fetal movement. No leakage of fluid. No vaginal bleeding.   OB History    Gravida  3   Para  1   Term  1   Preterm      AB  1   Living  1     SAB  1   TAB      Ectopic      Multiple      Live Births  1          One previous vaginal delivery in 2011 of a 7# 2oz infant. Otherwise uncomplicated.   Past Medical History:  Diagnosis Date  . Asthma    mild  . History of abnormal cervical Pap smear 2012, 2013, 2016    Past Surgical History:  Procedure Laterality Date  . NO PAST SURGERIES      Family History  Problem Relation Age of Onset  . Diabetes Paternal Grandmother     Social History:  reports that she has never smoked. She has never used smokeless tobacco. She reports that she does not drink alcohol or use drugs.  Allergies: No Known Allergies   Medications: I have reviewed the patient's current medications.  No results found for this or any previous visit (from the past 48 hour(s)).  No results found.  Review of Systems  Constitutional: Negative for chills, fever, malaise/fatigue and weight loss.  HENT: Negative for congestion, hearing loss and sinus pain.   Eyes: Negative for blurred vision and double vision.  Respiratory: Negative for cough, sputum production, shortness of breath and wheezing.   Cardiovascular: Negative for chest pain, palpitations, orthopnea and leg swelling.  Gastrointestinal: Negative for abdominal pain, constipation, diarrhea, nausea and vomiting.  Genitourinary: Negative for dysuria, flank pain, frequency, hematuria and urgency.  Musculoskeletal: Negative for back pain, falls and joint pain.  Skin: Negative for itching and rash.  Neurological: Negative for  dizziness and headaches.  Psychiatric/Behavioral: Negative for depression, substance abuse and suicidal ideas. The patient is not nervous/anxious.    Blood pressure 128/85, pulse (!) 107, temperature 97.8 F (36.6 C), temperature source Oral. Physical Exam  Nursing note and vitals reviewed. Constitutional: She is oriented to person, place, and time. She appears well-developed and well-nourished.  HENT:  Head: Normocephalic and atraumatic.  Cardiovascular: Normal rate and regular rhythm.  Respiratory: Effort normal and breath sounds normal.  GI: Soft. Bowel sounds are normal.  Musculoskeletal: Normal range of motion.  Neurological: She is alert and oriented to person, place, and time.  Skin: Skin is warm and dry.  Psychiatric: She has a normal mood and affect. Her behavior is normal. Judgment and thought content normal.    Assessment/Plan: 27 yo G3P1011 at 39 weeks 0 days gestational age. 1. IOL, elective at term in multiparous patient with bishop score >5. Will start with pitocin.  2. GBS negative 3. Epidural when patient desires.   Lemarcus Baggerly R Missie Gehrig 12/25/2017, 9:03 AM

## 2017-12-25 NOTE — Progress Notes (Signed)
Patient ID: Sierra Clay, female   DOB: Jul 30, 1991, 27 y.o.   MRN: [id number]  SVE 5/80/-3 Spontaneous rupture of clear fluid earlier Patient is much more uncomfortable. She is having an epidural placed now.   Adelene Idler MD [facility], [facility] 12/25/17 10:04 PM

## 2017-12-25 NOTE — Anesthesia Procedure Notes (Signed)
Epidural Patient location during procedure: OB Start time: 12/25/2017 10:12 PM End time: 12/25/2017 10:20 PM  Staffing Anesthesiologist: Yves Dill, MD Performed: anesthesiologist   Preanesthetic Checklist Completed: patient identified, site marked, surgical consent, pre-op evaluation, timeout performed, IV checked, risks and benefits discussed and monitors and equipment checked  Epidural Patient position: sitting Prep: Betadine Patient monitoring: heart rate, continuous pulse ox and blood pressure Approach: midline Location: L3-L4 Injection technique: LOR air  Needle:  Needle type: Tuohy  Needle gauge: 17 G Needle length: 9 cm and 9 Catheter type: closed end flexible Catheter size: 19 Gauge Test dose: negative and 1.5% lidocaine with Epi 1:200 K  Assessment Sensory level: T8 Events: blood not aspirated, injection not painful, no injection resistance, negative IV test and no paresthesia  Additional Notes Time out called.  Patient placed in sitting position.  Back prepped and draped in sterile fashion.  A skin wheal was made in the L3-L4 interspace with 1% Lidocaine plain.  A 17G Tuohy needle was advanced to the epidural space by a loss of resistance technique.  The epidural catheter was advanced 3 cm with a negative TD.  No blood or fluid and the patient tolerated the procedure well..  The catheter was affixed to the back in sterile fashion.Reason for block:procedure for pain

## 2017-12-25 NOTE — Progress Notes (Signed)
SVE: 4/50/-3 FHR: 140s, moderate variability, no decelerations, 15 x15 accelerations present. Category I tracing.  Toco: irregular  Continue to increase pitocin. Will reassess in several hours.   Adelene Idler MD [facility], [facility] 12/25/17 3:40 PM

## 2017-12-25 NOTE — Anesthesia Preprocedure Evaluation (Signed)
Anesthesia Evaluation  Patient identified by MRN, date of birth, ID band Patient awake    Reviewed: Allergy & Precautions, NPO status , Patient's Chart, lab work & pertinent test results  Airway Mallampati: II  TM Distance: >3 FB     Dental  (+) Teeth Intact   Pulmonary asthma ,    Pulmonary exam normal        Cardiovascular negative cardio ROS Normal cardiovascular exam     Neuro/Psych negative neurological ROS  negative psych ROS   GI/Hepatic negative GI ROS, Neg liver ROS,   Endo/Other  negative endocrine ROS  Renal/GU negative Renal ROS  negative genitourinary   Musculoskeletal negative musculoskeletal ROS (+)   Abdominal Normal abdominal exam  (+)   Peds negative pediatric ROS (+)  Hematology negative hematology ROS (+)   Anesthesia Other Findings   Reproductive/Obstetrics (+) Pregnancy                             Anesthesia Physical Anesthesia Plan  ASA: II  Anesthesia Plan: Epidural   Post-op Pain Management:    Induction:   PONV Risk Score and Plan:   Airway Management Planned: Natural Airway  Additional Equipment:   Intra-op Plan:   Post-operative Plan:   Informed Consent: I have reviewed the patients History and Physical, chart, labs and discussed the procedure including the risks, benefits and alternatives for the proposed anesthesia with the patient or authorized representative who has indicated his/her understanding and acceptance.     Dental advisory given  Plan Discussed with: CRNA and Surgeon  Anesthesia Plan Comments:         Anesthesia Quick Evaluation  

## 2017-12-26 DIAGNOSIS — Z3A39 39 weeks gestation of pregnancy: Secondary | ICD-10-CM

## 2017-12-26 DIAGNOSIS — O411230 Chorioamnionitis, third trimester, not applicable or unspecified: Secondary | ICD-10-CM

## 2017-12-26 LAB — RPR: RPR Ser Ql: NONREACTIVE

## 2017-12-26 MED ORDER — SODIUM CHLORIDE 0.9 % IV SOLN
INTRAVENOUS | Status: DC
  Administered 2017-12-26: 20:00:00 via INTRAVENOUS

## 2017-12-26 MED ORDER — FERROUS SULFATE 325 (65 FE) MG PO TABS
325.0000 mg | ORAL_TABLET | Freq: Two times a day (BID) | ORAL | Status: DC
  Administered 2017-12-27 – 2017-12-28 (×4): 325 mg via ORAL
  Filled 2017-12-26 (×4): qty 1

## 2017-12-26 MED ORDER — GENTAMICIN SULFATE 40 MG/ML IJ SOLN
5.0000 mg/kg | INTRAVENOUS | Status: DC
  Administered 2017-12-26: 440 mg via INTRAVENOUS
  Filled 2017-12-26: qty 11

## 2017-12-26 MED ORDER — MISOPROSTOL 200 MCG PO TABS
ORAL_TABLET | ORAL | Status: AC
  Administered 2017-12-26: 1000 ug via RECTAL
  Filled 2017-12-26: qty 1

## 2017-12-26 MED ORDER — OXYCODONE-ACETAMINOPHEN 5-325 MG PO TABS
1.0000 | ORAL_TABLET | ORAL | Status: DC | PRN
  Administered 2017-12-26 – 2017-12-27 (×2): 1 via ORAL
  Filled 2017-12-26 (×3): qty 1

## 2017-12-26 MED ORDER — METHYLERGONOVINE MALEATE 0.2 MG/ML IJ SOLN
INTRAMUSCULAR | Status: AC
  Administered 2017-12-26: 0.2 mg
  Filled 2017-12-26: qty 1

## 2017-12-26 MED ORDER — PRENATAL MULTIVITAMIN CH
1.0000 | ORAL_TABLET | Freq: Every day | ORAL | Status: DC
  Administered 2017-12-27 – 2017-12-28 (×2): 1 via ORAL
  Filled 2017-12-26 (×2): qty 1

## 2017-12-26 MED ORDER — ONDANSETRON HCL 4 MG/2ML IJ SOLN: 4.0000 mg | INTRAMUSCULAR | Status: DC | PRN

## 2017-12-26 MED ORDER — ZOLPIDEM TARTRATE 5 MG PO TABS: 5.0000 mg | ORAL_TABLET | Freq: Every evening | ORAL | Status: DC | PRN

## 2017-12-26 MED ORDER — OXYTOCIN 40 UNITS IN LACTATED RINGERS INFUSION - SIMPLE MED
INTRAVENOUS | Status: AC
  Administered 2017-12-27: 2.5 [IU]/h via INTRAVENOUS
  Filled 2017-12-26: qty 1000

## 2017-12-26 MED ORDER — BENZOCAINE-MENTHOL 20-0.5 % EX AERO: 1.0000 "application " | INHALATION_SPRAY | CUTANEOUS | Status: DC | PRN

## 2017-12-26 MED ORDER — OXYTOCIN 40 UNITS IN LACTATED RINGERS INFUSION - SIMPLE MED
2.5000 [IU]/h | INTRAVENOUS | Status: DC | PRN
  Administered 2017-12-27: 2.5 [IU]/h via INTRAVENOUS
  Filled 2017-12-26: qty 1000

## 2017-12-26 MED ORDER — OXYCODONE-ACETAMINOPHEN 5-325 MG PO TABS: 2.0000 | ORAL_TABLET | ORAL | Status: DC | PRN

## 2017-12-26 MED ORDER — CARBOPROST TROMETHAMINE 250 MCG/ML IM SOLN
INTRAMUSCULAR | Status: AC
  Administered 2017-12-26: 250 ug
  Filled 2017-12-26: qty 1

## 2017-12-26 MED ORDER — ACETAMINOPHEN 325 MG PO TABS
ORAL_TABLET | ORAL | Status: AC
Start: 2017-12-26 — End: 2017-12-27
  Filled 2017-12-26: qty 2

## 2017-12-26 MED ORDER — DIBUCAINE 1 % RE OINT: 1.0000 "application " | TOPICAL_OINTMENT | RECTAL | Status: DC | PRN

## 2017-12-26 MED ORDER — ACETAMINOPHEN 325 MG PO TABS: 650.0000 mg | ORAL_TABLET | ORAL | Status: DC | PRN

## 2017-12-26 MED ORDER — ACETAMINOPHEN 325 MG PO TABS
650.0000 mg | ORAL_TABLET | Freq: Four times a day (QID) | ORAL | Status: DC | PRN
  Administered 2017-12-26: 650 mg via ORAL

## 2017-12-26 MED ORDER — DOCUSATE SODIUM 100 MG PO CAPS
100.0000 mg | ORAL_CAPSULE | Freq: Two times a day (BID) | ORAL | Status: DC
  Administered 2017-12-26 – 2017-12-28 (×5): 100 mg via ORAL
  Filled 2017-12-26 (×5): qty 1

## 2017-12-26 MED ORDER — METHYLERGONOVINE MALEATE 0.2 MG PO TABS
0.2000 mg | ORAL_TABLET | ORAL | Status: DC | PRN
  Filled 2017-12-26: qty 1

## 2017-12-26 MED ORDER — WITCH HAZEL-GLYCERIN EX PADS: 1.0000 "application " | MEDICATED_PAD | CUTANEOUS | Status: DC | PRN

## 2017-12-26 MED ORDER — SIMETHICONE 80 MG PO CHEW: 80.0000 mg | CHEWABLE_TABLET | ORAL | Status: DC | PRN

## 2017-12-26 MED ORDER — DIPHENHYDRAMINE HCL 25 MG PO CAPS: 25.0000 mg | ORAL_CAPSULE | Freq: Four times a day (QID) | ORAL | Status: DC | PRN

## 2017-12-26 MED ORDER — IBUPROFEN 600 MG PO TABS
600.0000 mg | ORAL_TABLET | Freq: Four times a day (QID) | ORAL | Status: DC
  Administered 2017-12-26 – 2017-12-27 (×4): 600 mg via ORAL
  Filled 2017-12-26 (×4): qty 1

## 2017-12-26 MED ORDER — SODIUM CHLORIDE 0.9 % IV SOLN
2.0000 g | Freq: Four times a day (QID) | INTRAVENOUS | Status: DC
  Administered 2017-12-26: 2 g via INTRAVENOUS
  Filled 2017-12-26: qty 2000

## 2017-12-26 MED ORDER — METHYLERGONOVINE MALEATE 0.2 MG/ML IJ SOLN: 0.2000 mg | INTRAMUSCULAR | Status: DC | PRN

## 2017-12-26 MED ORDER — ONDANSETRON HCL 4 MG PO TABS: 4.0000 mg | ORAL_TABLET | ORAL | Status: DC | PRN

## 2017-12-26 MED ORDER — MAGNESIUM HYDROXIDE 400 MG/5ML PO SUSP
30.0000 mL | ORAL | Status: DC | PRN
  Filled 2017-12-26: qty 30

## 2017-12-26 MED ORDER — DIPHENHYDRAMINE HCL 50 MG/ML IJ SOLN
INTRAMUSCULAR | Status: AC
  Filled 2017-12-26: qty 1

## 2017-12-26 MED ORDER — BUTORPHANOL TARTRATE 2 MG/ML IJ SOLN
INTRAMUSCULAR | Status: AC
  Administered 2017-12-26: 2 mg
  Filled 2017-12-26: qty 1

## 2017-12-26 NOTE — Progress Notes (Signed)
Patient ID: Sierra Clay, female   DOB: [birth year], 27 y.o.   MRN: [id number]  SVE: 6-7/80/-1 IUPC placed. Will continue to increase pitocin. Position changes with cervix thicker on the patient's left. Clear fluid NST: 130s, moderate variability, no decelerations, more than 15x15 accelerations. Toco: every 2-3 minutes. Adelene Idler MD [facility], [facility] 12/26/17 6:38 AM

## 2017-12-27 LAB — CBC
HEMATOCRIT: 27.1 % — AB (ref 35.0–47.0)
HEMATOCRIT: 27.6 % — AB (ref 35.0–47.0)
Hemoglobin: 8.9 g/dL — ABNORMAL LOW (ref 12.0–16.0)
Hemoglobin: 9.5 g/dL — ABNORMAL LOW (ref 12.0–16.0)
MCH: 29.3 pg (ref 26.0–34.0)
MCH: 30 pg (ref 26.0–34.0)
MCHC: 32.9 g/dL (ref 32.0–36.0)
MCHC: 34.4 g/dL (ref 32.0–36.0)
MCV: 87.1 fL (ref 80.0–100.0)
MCV: 88.9 fL (ref 80.0–100.0)
PLATELETS: 176 10*3/uL (ref 150–440)
Platelets: 175 10*3/uL (ref 150–440)
RBC: 3.05 MIL/uL — AB (ref 3.80–5.20)
RBC: 3.17 MIL/uL — ABNORMAL LOW (ref 3.80–5.20)
RDW: 15 % — ABNORMAL HIGH (ref 11.5–14.5)
RDW: 14.9 % — AB (ref 11.5–14.5)
WBC: 24.9 10*3/uL — ABNORMAL HIGH (ref 3.6–11.0)
WBC: 27.9 10*3/uL — ABNORMAL HIGH (ref 3.6–11.0)

## 2017-12-27 MED ORDER — IBUPROFEN 600 MG PO TABS
600.0000 mg | ORAL_TABLET | Freq: Four times a day (QID) | ORAL | Status: DC
  Administered 2017-12-27 – 2017-12-28 (×5): 600 mg via ORAL
  Filled 2017-12-27 (×5): qty 1

## 2017-12-27 MED ORDER — COCONUT OIL OIL
1.0000 "application " | TOPICAL_OIL | Status: DC | PRN
  Filled 2017-12-27: qty 120

## 2017-12-27 NOTE — Progress Notes (Signed)
50mL fluid removed from bakri balloon per Dr. Jerene Pitch.

## 2017-12-27 NOTE — Lactation Note (Signed)
This note was copied from a baby's chart. Lactation Consultation Note  Patient Name: Sierra Clay [id number] Date: 12/27/2017   Mom had significant blood loss of 2665 ml after delivery requiring Bakri balloon which is still in place.  Observed Sierra Clay latching without difficulty with good rhythmic sucking and frequent audible swallows.  Mom breast fed first baby for over 1 year.  Reviewed feeding cues.  Reviewed normal course of lactation and routine newborn feeding cues.  Father of baby reports Sierra Clay wanting to cluster feed.  Discouraged supplementation at present.  Explained supply and demand and need to breast feed frequently on demand to bring in mature milk and ensure a plentiful milk supply.  Once mature milk transitions in, he should get full quicker and stay full longer.  Parents are tired, but both agree with plan to breast feed whenever he shows hunger/feeding cues.  Lactation name and number given to parents and encouraged to call for question, concerns or assistance.  Maternal Data    Feeding Feeding Type: Breast Fed Length of feed: 40 min(per mother)  [facility] Score                   Interventions    Lactation Tools Discussed/Used     Consult Status      Louis Meckel 12/27/2017, 2:47 PM

## 2017-12-27 NOTE — Progress Notes (Signed)
PPD#0 VAVD with PPH Subjective:  Resting in bed, no acute distress. Denies pain. Foley catheter in place and draining with adequate output. Bakri balloon is being slowly deflated at 50 mL/hr and output from Bakri is small. Tolerating a regular diet.   Objective:   Blood pressure 121/71, pulse 90, temperature 98.1 F (36.7 C), temperature source Oral, resp. rate 18, height  (1.549 m), weight 196 lb (88.9 kg), SpO2 97 %, currently breastfeeding.  General: NAD Pulmonary: no increased work of breathing Abdomen: non-distended, non-tender Uterus:  fundus firm at U; Bakri balloon in place Extremities: SCDs in place, no complaints of pain  Results for orders placed or performed during the hospital encounter of 12/25/17 (from the past 72 hour(s))  CBC     Status: Abnormal   Collection Time: 12/25/17  9:10 AM  Result Value Ref Range   WBC 11.5 (H) 3.6 - 11.0 K/uL   RBC 4.17 3.80 - 5.20 MIL/uL   Hemoglobin 12.3 12.0 - 16.0 g/dL   HCT 16.1 09.6 - 04.5 %   MCV 88.1 80.0 - 100.0 fL   MCH 29.6 26.0 - 34.0 pg   MCHC 33.6 32.0 - 36.0 g/dL   RDW 40.9 (H) 81.1 - 91.4 %   Platelets 222 150 - 440 K/uL    Comment: Performed at [facility], [address] [place], [place] [address]  Type and screen [facility]     Status: None   Collection Time: 12/25/17  9:10 AM  Result Value Ref Range   ABO/RH(D) O POS    Antibody Screen NEG    Sample Expiration      12/28/2017 Performed at [facility], [address] [place], [place] [address]   RPR     Status: None   Collection Time: 12/25/17  9:10 AM  Result Value Ref Range   RPR Ser Ql Non Reactive Non Reactive    Comment: (NOTE) Performed At: [facility] [address] [place], [place] [id number] Jolene Schimke MD [phone] Performed at [facility], [address] [place], [place] [address]   Rapid HIV screen (HIV 1/2 Ab+Ag) ([facility] Only)     Status: None   Collection Time:  12/25/17  9:10 AM  Result Value Ref Range   HIV-1 P24 Antigen - HIV24 NON REACTIVE NON REACTIVE   HIV 1/2 Antibodies NON REACTIVE NON REACTIVE   Interpretation (HIV Ag Ab)      A non reactive test result means that HIV 1 or HIV 2 antibodies and HIV 1 p24 antigen were not detected in the specimen.    Comment: Performed at [facility], [address] [place], [place] [address]  CBC     Status: Abnormal   Collection Time: 12/26/17 11:54 PM  Result Value Ref Range   WBC 27.9 (H) 3.6 - 11.0 K/uL   RBC 3.17 (L) 3.80 - 5.20 MIL/uL   Hemoglobin 9.5 (L) 12.0 - 16.0 g/dL   HCT 40.3 (L) 47.4 - 25.9 %   MCV 87.1 80.0 - 100.0 fL   MCH 30.0 26.0 - 34.0 pg   MCHC 34.4 32.0 - 36.0 g/dL   RDW 56.3 (H) 87.5 - 64.3 %   Platelets 175 150 - 440 K/uL    Comment: Performed at [facility], [address], [place], [place] [address]  CBC     Status: Abnormal   Collection Time: 12/27/17  5:28 AM  Result Value Ref Range   WBC 24.9 (  H) 3.6 - 11.0 K/uL   RBC 3.05 (L) 3.80 - 5.20 MIL/uL   Hemoglobin 8.9 (L) 12.0 - 16.0 g/dL   HCT 16.1 (L) 09.6 - 04.5 %   MCV 88.9 80.0 - 100.0 fL   MCH 29.3 26.0 - 34.0 pg   MCHC 32.9 32.0 - 36.0 g/dL   RDW 40.9 (H) 81.1 - 91.4 %   Platelets 176 150 - 440 K/uL    Comment: Performed at [facility], [address], [place], [place] [address]    Assessment:   27 y.o. [id number] postpartum day #0 in stable condition.  Plan:    1) Acute blood loss anemia - hemodynamically stable and asymptomatic. PO ferrous sulfate. Observe for symptoms once ambulatory.  2) Blood Type --/--/O POS (05/25 0910) /   3) Rubella 20.40 (11/15 1054) / Varicella immune/ TDAP status: given 3/25  4) Breast feeding  5) Contraception: considering IUD  6) Disposition: continue postpartum care. Plan to remove Bakri Balloon at 24 hours.  Marcelyn Bruins, CNM 12/27/2017

## 2017-12-27 NOTE — Discharge Summary (Signed)
OB Discharge Summary     Patient Name: Sierra Clay DOB: 27-May-1991 MRN: [id number]  Date of admission: 12/25/2017 Delivering MD: Natale Milch, MD  Date of Delivery: 12/26/2017  Date of discharge: 12/28/2017  Admitting diagnosis: induction Intrauterine pregnancy: [redacted]w[redacted]d     Secondary diagnosis: Elective Induction of Labor     Discharge diagnosis: Term Pregnancy Delivered, Chorioamnionitis and Post partum hemorrhage                         Hospital course:  Induction of Labor With Vaginal Delivery   27 y.o. yo [id number] at [redacted]w[redacted]d was admitted to the hospital 12/25/2017 for induction of labor.  Indication for induction: Favorable cervix at term.  Patient had an uncomplicated labor course as follows: Membrane Rupture Time/Date: 9:40 PM ,12/25/2017   Intrapartum Procedures: Episiotomy: Median [2]                                         Lacerations:     Patient had delivery of a Viable infant.  Information for the patient's newborn:  Sierra, Clay [[id number]]  Delivery Method: Vaginal, Vacuum (Extractor)(Filed from Delivery Summary)   12/26/2017  Details of delivery can be found in separate delivery note.  Patient had a routine postpartum course. Patient is discharged home 12/28/17.                                                                 Post partum procedures:none  Complications: Hemorrhage>1059mL  Physical exam on 12/28/2017: Vitals:   12/27/17 1625 12/27/17 2007 12/28/17 0420 12/28/17 0812  BP: 121/71 122/75 110/62 118/78  Pulse: 90 98 89 86  Resp: Temp: 98.1 F (36.7 C) 98.2 F (36.8 C) 97.9 F (36.6 C) 97.7 F (36.5 C)  TempSrc: Oral Oral Oral Oral  SpO2:  98% 98% 97%  Weight:      Height:       General: alert, cooperative and no distress Lochia: appropriate Uterine Fundus: firm Incision: N/A DVT Evaluation: No evidence of DVT seen on physical exam.  Labs: Lab Results  Component Value Date   WBC 24.9 (H) 12/27/2017   HGB 8.9 (L)  12/27/2017   HCT 27.1 (L) 12/27/2017   MCV 88.9 12/27/2017   PLT 176 12/27/2017   No flowsheet data found.  Discharge instruction: per After Visit Summary.  Medications:  Allergies as of 12/28/2017   No Known Allergies     Medication List    TAKE these medications   ibuprofen 600 MG tablet Commonly known as:  ADVIL,MOTRIN Take 1 tablet (600 mg total) by mouth every 6 (six) hours.   oxyCODONE-acetaminophen 5-325 MG tablet Commonly known as:  PERCOCET/ROXICET Take 1 tablet by mouth every 6 (six) hours as needed for up to 3 days for severe pain (pain scale 4-7).       Diet: routine diet  Activity: Advance as tolerated. Pelvic rest for 6 weeks.   Outpatient follow up: Follow-up Information    Schuman, Jaquelyn Bitter, MD. Schedule an appointment as soon as possible for a visit in 2 week(s).   Specialty:  Obstetrics and  Gynecology Contact information: [address] [place] [address] [phone]  Discuss birth control options           Postpartum contraception: considering either: patch, ring or Paragard IUD Rhogam Given postpartum: NA Rubella vaccine given postpartum: Rubella Immune Varicella vaccine given postpartum: Varicella Immune TDaP given antepartum or postpartum: Yes  Newborn Data: Live born female  Birth Weight: 7 lb 4.4 oz (3300 g) APGAR: 5, 6, 7  Newborn Delivery   Birth date/time:  12/26/2017 15:51:00 Delivery type:  Vaginal, Vacuum (Extractor)      Baby Feeding: Breast  Disposition: Mom is discharged and may stay in hospital while baby is under bili lights   SIGNED:  Tresea Mall, CNM 12/28/2017 9:54 AM

## 2017-12-27 NOTE — Progress Notes (Signed)
Removed remaining volume (approximately 55 mL) from Bakri balloon. 5 mL output before procedure and approximately 10 mL residual blood upon removal. Fundus firm, no clots or bleeding observed on fundal check after removal was complete. She tolerated the procedure well. Plan is to observe urine output and bleeding over the next hour and remove Foley catheter if both remain stable.  Marcelyn Bruins, CNM 12/27/2017  4:54 PM

## 2017-12-27 NOTE — Anesthesia Postprocedure Evaluation (Signed)
Anesthesia Post Note  Patient: Sierra Clay  Procedure(s) Performed: AN AD HOC LABOR EPIDURAL  Patient location during evaluation: L&D Anesthesia Type: Epidural Level of consciousness: awake and alert Pain management: pain level controlled Vital Signs Assessment: post-procedure vital signs reviewed and stable Respiratory status: spontaneous breathing, nonlabored ventilation and respiratory function stable Cardiovascular status: stable Postop Assessment: no headache, no backache and patient able to bend at knees Anesthetic complications: no     Last Vitals:  Vitals:   12/27/17 0710 12/27/17 0715  BP:  113/79  Pulse:  (!) 108  Resp:  18  Temp:  37 C  SpO2: 96%     Last Pain:  Vitals:   12/27/17 0715  TempSrc: Oral  PainSc: 3                  Cleda Mccreedy Jalaila Caradonna

## 2017-12-27 NOTE — Progress Notes (Signed)
Patient ID: Sierra Clay, female   DOB: [birth year], 27 y.o.   MRN: [id number] Progress Note  CHIEF COMPLAINT:  No chief complaint on file.   Subjective  Patient sitting upright in bed. She has just finished breast feeding. She is feeling well. She has not been out of bed yet. She denies headache or dizziness. Bakri balloon is still in place. Hourly drainage has been low about 10-15 cc overnight.  Objective   Examination:  General exam: Appears calm and comfortable  Respiratory system: Clear to auscultation. Respiratory effort normal. HEENT: [place]/AT, PERRLA, no thrush, no stridor. Cardiovascular system: S1 & S2 heard, RRR. No JVD, murmurs, rubs, gallops or clicks. No pedal edema. Gastrointestinal system: Abdomen is nondistended, soft and nontender. No organomegaly or masses felt. Normal bowel sounds heard. Fundal height appropriate. Central nervous system: Alert and oriented. No focal neurological deficits. Extremities: Symmetric 5 x 5 power. Skin: No rashes, lesions or ulcers Psychiatry: Judgement and insight appear normal. Mood & affect appropriate.   VITALS:  height is  (1.549 m) and weight is 196 lb (88.9 kg). Her oral temperature is 98.3 F (36.8 C). Her blood pressure is 110/67 and her pulse is 111 (abnormal). Her respiration is 16 and oxygen saturation is 97%.   HGB: 9.5  Radiology Reports No results found.     Assessment/Plan:  27 yo s/p VAVD complicated by postpartum hemorrhage.  1. Tachycardic, blood pressure stable, no active heavy bleeding. Continue to monitor, repeat CBC in the morning.  2. Continue with routine postpartum care   Code Status: Full  Time spent: 10 minutes spent in room with patient.   LOS: 2 days   Adelene Idler MD [facility], [facility] 12/27/17 3:27 AM

## 2017-12-28 ENCOUNTER — Encounter: Payer: BLUE CROSS/BLUE SHIELD | Admitting: Obstetrics and Gynecology

## 2017-12-28 MED ORDER — OXYCODONE-ACETAMINOPHEN 5-325 MG PO TABS: 1.0000 | ORAL_TABLET | Freq: Four times a day (QID) | ORAL | 0 refills | Status: AC | PRN

## 2017-12-28 MED ORDER — IBUPROFEN 600 MG PO TABS: 600.0000 mg | ORAL_TABLET | Freq: Four times a day (QID) | ORAL | 0 refills | Status: DC

## 2017-12-28 NOTE — Progress Notes (Signed)
Discharge instructions provided.  Pt and sig other verbalize understanding of all instructions and follow-up care.  Prescription given.  Pt to remain in room with infant until infant is ready for discharge. Reynold Bowen, RN 12/28/2017 5:28 PM

## 2017-12-28 NOTE — Progress Notes (Signed)
pt discharged.  Discharge instructions, prescriptions and follow up appointment given to and reviewed with pt.  Pt verbalized understanding, all questions answered.

## 2017-12-28 NOTE — Lactation Note (Signed)
This note was copied from a baby's chart. Lactation Consultation Note  Patient Name: Sierra Clay Date: 12/28/2017 Reason for consult: Follow-up assessment;Hyperbilirubinemia;Other (Comment)(MOm had EBL >2L)   Mom needed gentle reminders for proper skin to skin hold and breast support for deep latch. Because he is quite sleepy, parents were shown how to keep baby awake with gentle stimulation and breast compression. Bili blanket placed along his back side during the feeding.    Maternal Data    Feeding Feeding Type: Breast Fed(right)  LATCH Score Latch: Repeated attempts needed to sustain latch, nipple held in mouth throughout feeding, stimulation needed to elicit sucking reflex.  Audible Swallowing: A few with stimulation  Type of Nipple: Everted at rest and after stimulation  Comfort (Breast/Nipple): Soft / non-tender  Hold (Positioning): Assistance needed to correctly position infant at breast and maintain latch.  LATCH Score: 7  Interventions Interventions: Breast feeding basics reviewed;Assisted with latch;Skin to skin;Breast compression;Adjust position;Support pillows;Position options;Expressed milk  Lactation Tools Discussed/Used     Consult Status Consult Status: Follow-up Date: 12/29/17 Follow-up type: In-patient    Sunday Corn 12/28/2017, 10:24 AM

## 2017-12-29 LAB — SURGICAL PATHOLOGY

## 2017-12-30 ENCOUNTER — Telehealth: Payer: Self-pay

## 2017-12-30 NOTE — Telephone Encounter (Signed)
FMLA/DISABILITY additional information filled out, signature obtained, and given to [place] for processing.

## 2018-01-11 ENCOUNTER — Ambulatory Visit (INDEPENDENT_AMBULATORY_CARE_PROVIDER_SITE_OTHER): Payer: BLUE CROSS/BLUE SHIELD | Admitting: Obstetrics and Gynecology

## 2018-01-11 ENCOUNTER — Encounter: Payer: Self-pay | Admitting: Obstetrics and Gynecology

## 2018-01-11 ENCOUNTER — Ambulatory Visit: Payer: BLUE CROSS/BLUE SHIELD | Admitting: Obstetrics and Gynecology

## 2018-01-11 ENCOUNTER — Telehealth: Payer: Self-pay | Admitting: Obstetrics and Gynecology

## 2018-01-11 NOTE — Telephone Encounter (Signed)
Patient scheduled 7/15 for Mirena insertion with CS

## 2018-01-11 NOTE — Progress Notes (Signed)
  OBSTETRICS POSTPARTUM CLINIC PROGRESS NOTE  Subjective:     Sierra Clay is a 27 y.o. 743P2012 female who presents for a postpartum visit. She is 2 weeks postpartum following a Term pregnancy and delivery by Vaginal problems after delivery including postpartum hemorrhage, chorioamnionitis.  I have fully reviewed the prenatal and intrapartum course. Anesthesia: epidural.  Postpartum course has been complicated by complicated by anemia.  Baby is feeding by Breast.  Bleeding: patient has not  resumed menses.  Bowel function is normal. Bladder function is normal.  Patient is not sexually active. Contraception method desired is IUD.  Postpartum depression screening: negative.   The following portions of the patient's history were reviewed and updated as appropriate: allergies, current medications, past family history, past medical history, past social history, past surgical history and problem list.  Review of Systems Pertinent items are noted in HPI.  Objective:    BP 108/68   Pulse 66   Ht 5\' 3"  (1.6 m)   Wt 173 lb (78.5 kg)   BMI 30.65 kg/m   General:  alert and no distress   Breasts:  inspection negative, no nipple discharge or bleeding, no masses or nodularity palpable  Lungs: clear to auscultation bilaterally  Heart:  regular rate and rhythm, S1, S2 normal, no murmur, click, rub or gallop  Abdomen: soft, non-tender; bowel sounds normal; no masses,  no organomegaly.    Vulva:  normal  Vagina: normal vagina, no discharge, exudate, lesion, or erythema- small vaginal bleeding, episiotomy healing well. Tenderness at midline knot.  Cervix:  no cervical motion tenderness and no lesions  Corpus: normal size, contour, position, consistency, mobility, non-tender  Adnexa:  normal adnexa and no mass, fullness, tenderness  Rectal Exam: Not performed.          Assessment:  Post Partum Care visit 1. Postpartum care following vacuum assisted vaginal delivery    Plan:  See orders  and Patient Instructions Follow up in: 4 weeks or as needed.   Desires IUD, follow up in 4 weeks for placement.  Sierra Idlerhristanna Takaya Hyslop MD [facility], [facility] 01/11/18 10:44 AM

## 2018-01-27 NOTE — Telephone Encounter (Signed)
Noted. Will order to arrive by apt date/time. 

## 2018-02-14 ENCOUNTER — Other Ambulatory Visit: Payer: Self-pay

## 2018-02-14 ENCOUNTER — Encounter: Payer: Self-pay | Admitting: Obstetrics and Gynecology

## 2018-02-14 ENCOUNTER — Ambulatory Visit (INDEPENDENT_AMBULATORY_CARE_PROVIDER_SITE_OTHER): Payer: BLUE CROSS/BLUE SHIELD | Admitting: Obstetrics and Gynecology

## 2018-02-14 VITALS — BP 120/76 | HR 81 | Ht 63.0 in | Wt 168.0 lb

## 2018-02-14 DIAGNOSIS — D500 Iron deficiency anemia secondary to blood loss (chronic): Secondary | ICD-10-CM | POA: Diagnosis not present

## 2018-02-14 DIAGNOSIS — Z30430 Encounter for insertion of intrauterine contraceptive device: Secondary | ICD-10-CM | POA: Diagnosis not present

## 2018-02-14 LAB — POCT HEMOGLOBIN: HEMOGLOBIN: 11.2 g/dL — AB (ref 12.2–16.2)

## 2018-02-14 MED ORDER — LEVONORGESTREL 20 MCG/24HR IU IUD: INTRAUTERINE_SYSTEM | Freq: Once | INTRAUTERINE | Status: DC

## 2018-02-14 NOTE — Progress Notes (Signed)
  OBSTETRICS POSTPARTUM CLINIC PROGRESS NOTE  Subjective:     Sierra Clay is a 27 y.o. 103P2012 female who presents for a postpartum visit. She is 6 weeks postpartum following a Term pregnancy and delivery by VAVD.  I have fully reviewed the prenatal and intrapartum course. Anesthesia: epidural.  Postpartum course has been complicated by anemia.  Baby is feeding by Breast.  Bleeding: patient has not  resumed menses.  Bowel function is normal. Bladder function is normal.  Patient is not sexually active. Contraception method desired is IUD.  Postpartum depression screening: negative. [place] 0.  The following portions of the patient's history were reviewed and updated as appropriate: allergies, current medications, past family history, past medical history, past social history, past surgical history and problem list.  Review of Systems Pertinent items are noted in HPI.  Objective:    BP 120/76 (BP Location: Right Arm, Patient Position: Sitting, Cuff Size: Normal)   Pulse 81   Ht 5\' 3"  (1.6 m)   Wt 168 lb (76.2 kg)   LMP 12/26/2017 (Approximate)   Breastfeeding? Yes   BMI 29.76 kg/m   General:  alert and no distress   Breasts:  inspection negative, no nipple discharge or bleeding, no masses or nodularity palpable  Lungs: clear to auscultation bilaterally  Heart:  regular rate and rhythm, S1, S2 normal, no murmur, click, rub or gallop  Abdomen: soft, non-tender; bowel sounds normal; no masses,  no organomegaly.     Vulva:  normal  Vagina: normal vagina, no discharge, exudate, lesion, or erythema  Cervix:  no cervical motion tenderness and no lesions  Corpus: normal size, contour, position, consistency, mobility, non-tender  Adnexa:  normal adnexa and no mass, fullness, tenderness  Rectal Exam: Not performed.        IUD PROCEDURE NOTE:  Sierra Clay is a 27 y.o. [phone] here for IUD insertion. No GYN concerns.  Last pap smear was normal.  IUD Insertion Procedure  Note Patient identified, informed consent performed, consent signed.   Discussed risks of irregular bleeding, cramping, infection, malpositioning or misplacement of the IUD outside the uterus which may require further procedure such as laparoscopy, risk of failure <1%. Time out was performed.  Urine pregnancy test negative.  A bimanual exam showed the uterus to be anteverted.  Speculum placed in the vagina.  Cervix visualized.  Cleaned with Betadine x 2.  Grasped anteriorly with a single tooth tenaculum.  Uterus sounded to 9 cm.   IUD placed per manufacturer's recommendations.  Strings trimmed to 3 cm. Tenaculum was removed, good hemostasis noted.  Patient tolerated procedure well.   Patient was given post-procedure instructions.  She was advised to have backup contraception for one week.  Patient was also asked to check IUD strings periodically and follow up in 4 weeks for IUD check.   Assessment:  Post Partum Care visit 1. Anemia due to blood loss Improved today on hgb finger stick - POCT hemoglobin  2. Postpartum care following vaginal delivery Uncomplicated, may return to work  3. Encounter for IUD insertion  - levonorgestrel (MIRENA) 20 MCG/24HR IUD   Plan:  Resume all normal activities Follow up in: 1 month or as needed.    Adelene Idlerhristanna Aaliah Jorgenson MD [facility], [facility] 02/14/18 11:19 AM

## 2018-02-24 NOTE — Telephone Encounter (Signed)
Mirena was rcvd 02/14/18

## 2018-03-14 ENCOUNTER — Encounter: Payer: Self-pay | Admitting: Obstetrics and Gynecology

## 2018-03-14 ENCOUNTER — Ambulatory Visit (INDEPENDENT_AMBULATORY_CARE_PROVIDER_SITE_OTHER): Payer: BLUE CROSS/BLUE SHIELD | Admitting: Obstetrics and Gynecology

## 2018-03-14 VITALS — BP 100/60 | Ht 63.0 in | Wt 161.0 lb

## 2018-03-14 DIAGNOSIS — Z30431 Encounter for routine checking of intrauterine contraceptive device: Secondary | ICD-10-CM

## 2018-03-14 NOTE — Progress Notes (Signed)
  History of Present Illness:  Maki I Mazzoni is a 27 y.o. that had a Mirena IUD placed approximately 4 wIhor Austineeks ago. Since that time, she states that she has had no bleeding, discharge, itching and pain  PMHx: She  has a past medical history of Asthma and History of abnormal cervical Pap smear (2012, 2013, 2016). Also,  has a past surgical history that includes No past surgeries., family history includes Diabetes in her paternal grandmother.,  reports that she has never smoked. She has never used smokeless tobacco. She reports that she does not drink alcohol or use drugs. Current Meds  Medication Sig  . ferrous sulfate 325 (65 FE) MG tablet Take 325 mg by mouth daily with breakfast.   Current Facility-Administered Medications for the 03/14/18 encounter (Office Visit) with Natale MilchSchuman, Laquincy Eastridge R, MD  Medication  . levonorgestrel (MIRENA) 20 MCG/24HR IUD  .  Also, has No Known Allergies..  ROS  Physical Exam:  BP 100/60   Ht 5\' 3"  (1.6 m)   Wt 161 lb (73 kg)   BMI 28.52 kg/m  Body mass index is 28.52 kg/m. Constitutional: Well nourished, well developed female in no acute distress.  Abdomen: diffusely non tender to palpation, non distended, and no masses, hernias Neuro: Grossly intact Psych:  Normal mood and affect.    Pelvic exam:  Two IUD strings present seen coming from the cervical os. EGBUS, vaginal vault and cervix: within normal limits  Assessment: IUD strings present in proper location; pt doing well  Plan: She was told to continue to use barrier contraception, in order to prevent any STIs, and to take a home pregnancy test or call us if she ever thinks she may be pregnant, and that her IUD expires in 5 years.  She was amenable to this plan and we will see her back in 1 year/PRN.  A total of 15 minutes were spent face-to-face with the patient during this encounter and over half of that time dealt with counseling and coordination of care.  Adelene Idlerhristanna Merry Pond MD [facility], [facility] 03/14/18 11:05 AM

## 2018-04-20 ENCOUNTER — Telehealth (HOSPITAL_COMMUNITY): Payer: Self-pay | Admitting: Lactation Services

## 2018-04-20 NOTE — Telephone Encounter (Signed)
Mother states her breastmilk has a greenish tint. She suspects it is her vitamin supplements.  Reassured mother regarding colors of breastmilk.

## 2018-05-12 DIAGNOSIS — K05322 Chronic periodontitis, generalized, moderate: Secondary | ICD-10-CM | POA: Diagnosis not present

## 2018-06-17 DIAGNOSIS — S161XXA Strain of muscle, fascia and tendon at neck level, initial encounter: Secondary | ICD-10-CM | POA: Diagnosis not present

## 2018-08-18 ENCOUNTER — Encounter: Payer: Self-pay | Admitting: Obstetrics and Gynecology

## 2018-08-21 ENCOUNTER — Ambulatory Visit
Admission: EM | Admit: 2018-08-21 | Discharge: 2018-08-21 | Disposition: A | Discharge status: Home / Self Care | Payer: BC Managed Care – PPO | Attending: Family Medicine | Admitting: Family Medicine

## 2018-08-21 DIAGNOSIS — J101 Influenza due to other identified influenza virus with other respiratory manifestations: Secondary | ICD-10-CM

## 2018-08-21 DIAGNOSIS — B9789 Other viral agents as the cause of diseases classified elsewhere: Secondary | ICD-10-CM

## 2018-08-21 DIAGNOSIS — J029 Acute pharyngitis, unspecified: Secondary | ICD-10-CM

## 2018-08-21 DIAGNOSIS — J09X2 Influenza due to identified novel influenza A virus with other respiratory manifestations: Secondary | ICD-10-CM | POA: Diagnosis not present

## 2018-08-21 DIAGNOSIS — R0981 Nasal congestion: Secondary | ICD-10-CM

## 2018-08-21 DIAGNOSIS — N61 Mastitis without abscess: Secondary | ICD-10-CM | POA: Insufficient documentation

## 2018-08-21 DIAGNOSIS — R509 Fever, unspecified: Secondary | ICD-10-CM | POA: Diagnosis not present

## 2018-08-21 DIAGNOSIS — N644 Mastodynia: Secondary | ICD-10-CM

## 2018-08-21 LAB — RAPID STREP SCREEN (MED CTR MEBANE ONLY): STREPTOCOCCUS, GROUP A SCREEN (DIRECT): NEGATIVE

## 2018-08-21 LAB — RAPID INFLUENZA A&B ANTIGENS
Influenza A (ARMC): POSITIVE — AB
Influenza B (ARMC): NEGATIVE

## 2018-08-21 MED ORDER — DICLOXACILLIN SODIUM 500 MG PO CAPS: 500.0000 mg | ORAL_CAPSULE | Freq: Four times a day (QID) | ORAL | 0 refills | Status: AC

## 2018-08-21 MED ORDER — OSELTAMIVIR PHOSPHATE 75 MG PO CAPS: 75.0000 mg | ORAL_CAPSULE | Freq: Two times a day (BID) | ORAL | 0 refills | Status: DC

## 2018-08-21 NOTE — Discharge Instructions (Addendum)
Take medication as prescribed. Rest. Drink plenty of fluids.  Over-the-counter Tylenol and ibuprofen as needed.  Good supportive bra.  Empty breast.  Follow up with your primary care physician this week as needed. Return to Urgent care for new or worsening concerns.

## 2018-08-21 NOTE — ED Provider Notes (Signed)
[facility] ____________________________________________  Time seen: Approximately 2:05 PM  I have reviewed the triage vital signs and the nursing notes.   HISTORY  Chief Complaint Fever and Breast Pain  HPI LORAINNE MCKINZEY is a 28 y.o. female presenting with family at bedside for evaluation of nasal congestion, sore throat, body aches and subjective fever since yesterday.  Reports subjective fever last night.  Also reports for the last 2 days left breast has been tender and felt very full even though continues to pump.  Patient reports that she is pumping for her 52-month-old.  States 29-month-old is a healthy child.  Continues to eat and drink well.  States sore throat is mild.  Not much of a cough.  Has taken some Tylenol.  Denies other over-the-counter medications for the same complaints.  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  Denies other aggravating alleviating factors.  Has continued main active.  Reports otherwise doing well denies other complaints.  Denies current pregnancy.  [facility]: PCP   Past Medical History:  Diagnosis Date  . Asthma    mild  . History of abnormal cervical Pap smear 2012, 2013, 2016    Patient Active Problem List   Diagnosis Date Noted  . Irregular uterine contractions 12/21/2017    Past Surgical History:  Procedure Laterality Date  . NO PAST SURGERIES        Current Facility-Administered Medications:  .  levonorgestrel (MIRENA) 20 MCG/24HR IUD, , Intrauterine, Once, Schuman, Christanna R, MD  Current Outpatient Medications:  .  dicloxacillin (DYNAPEN) 500 MG capsule, Take 1 capsule (500 mg total) by mouth 4 (four) times daily for 10 days., Disp: 40 capsule, Rfl: 0 .  ferrous sulfate 325 (65 FE) MG tablet, Take 325 mg by mouth daily with breakfast., Disp: , Rfl:  .  ibuprofen (ADVIL,MOTRIN) 600 MG tablet, Take 1 tablet (600 mg total) by mouth every 6 (six) hours. (Patient not taking:  Reported on 01/11/2018), Disp: 30 tablet, Rfl: 0 .  oseltamivir (TAMIFLU) 75 MG capsule, Take 1 capsule (75 mg total) by mouth every 12 (twelve) hours., Disp: 10 capsule, Rfl: 0  Allergies Patient has no known allergies.  Family History  Problem Relation Age of Onset  . Diabetes Paternal Grandmother     Social History Social History   Tobacco Use  . Smoking status: Never Smoker  . Smokeless tobacco: Never Used  Substance Use Topics  . Alcohol use: No  . Drug use: No    Review of Systems Constitutional: subjective fever ENT:as above.  Cardiovascular: Denies chest pain. Respiratory: Denies shortness of breath. Gastrointestinal: No abdominal pain.  No nausea, no vomiting.  No diarrhea. Genitourinary: Negative for dysuria. Musculoskeletal: Negative for back pain. Skin: Negative for rash.   ____________________________________________   PHYSICAL EXAM:  VITAL SIGNS: ED Triage Vitals  Enc Vitals Group     BP 08/21/18 1228 134/89     Pulse Rate 08/21/18 1228 (!) 105     Resp 08/21/18 1228 18     Temp 08/21/18 1228 97.9 F (36.6 C)     Temp Source 08/21/18 1228 Oral     SpO2 08/21/18 1228 97 %     Weight 08/21/18 1229 164 lb (74.4 kg)     Height 08/21/18 1229 5\' 2"  (1.575 m)     Head Circumference --      Peak Flow --      Pain Score 08/21/18 1229 7  Pain Loc --      Pain Edu? --      Excl. in GC? --     Constitutional: Alert and oriented. Well appearing and in no acute distress. Eyes: Conjunctivae are normal. PERRL. EOMI. Head: Atraumatic. No sinus tenderness to palpation. No swelling. No erythema.  Ears: no erythema, normal TMs bilaterally.   Nose:Nasal congestion with clear rhinorrhea  Mouth/Throat: Mucous membranes are moist. Mild pharyngeal erythema. No tonsillar swelling or exudate.  Neck: No stridor.  No cervical spine tenderness to palpation. Hematological/Lymphatic/Immunilogical: No cervical lymphadenopathy. Cardiovascular: Normal rate, regular  rhythm. Grossly normal heart sounds.  Good peripheral circulation. Respiratory: Normal respiratory effort.  No retractions. No wheezes, rales or rhonchi. Good air movement.  Gastrointestinal: Soft and nontender.  Musculoskeletal: Ambulatory with steady gait. No cervical, thoracic or lumbar tenderness to palpation. Neurologic:  Normal speech and language. No gait instability. Skin:  Skin appears warm, dry.  Except: Declined chaperone.  Left breast at 2-4 o'clock area of mild erythema and mild induration firmness with tenderness, breast otherwise nontender.  Skin intact Psychiatric: Mood and affect are normal. Speech and behavior are normal. ___________________________________________   LABS (all labs ordered are listed, but only abnormal results are displayed)  Labs Reviewed  RAPID INFLUENZA A&B ANTIGENS ([facility] ONLY) - Abnormal; Notable for the following components:      Result Value   Influenza A ([facility]) POSITIVE (*)    All other components within normal limits  RAPID STREP SCREEN ([facility] ONLY)  CULTURE, GROUP A STREP [facility])    PROCEDURES Procedures    INITIAL IMPRESSION / ASSESSMENT AND PLAN / ED COURSE  Pertinent labs & imaging results that were available during my care of the patient were reviewed by me and considered in my medical decision making (see chart for details).  Well-appearing patient.  No acute distress.  Quick strep negative, will culture.  Influenza A positive.  Also suspect mastitis.  Nursing a healthy 34-month-old.  Will treat with dicloxacillin x10 days as well as discussed treatment options for flu, will treat with Tamiflu.  Encouraged to continue emptying breast good supportive bra over-the-counter Tylenol, ibuprofen and supportive care.  Rest, fluids.Discussed indication, risks and benefits of medications with patient.  Discussed follow up with Primary care physician this week. Discussed follow up and return parameters including no resolution or any  worsening concerns. Patient verbalized understanding and agreed to plan.   ____________________________________________   FINAL CLINICAL IMPRESSION(S) / ED DIAGNOSES  Final diagnoses:  Acute mastitis of left breast  Influenza A     ED Discharge Orders         Ordered    dicloxacillin (DYNAPEN) 500 MG capsule  4 times daily     08/21/18 1312    oseltamivir (TAMIFLU) 75 MG capsule  Every 12 hours     08/21/18 1312           Note: This dictation was prepared with Dragon dictation along with smaller phrase technology. Any transcriptional errors that result from this process are unintentional.         Renford Dills, NP 08/21/18 1510

## 2018-08-21 NOTE — ED Triage Notes (Signed)
Pt here for fever last night, headache, feels lightheaded and sore throat. Did take tylenol

## 2018-08-24 ENCOUNTER — Ambulatory Visit: Payer: Self-pay | Admitting: Family Medicine

## 2018-08-24 LAB — CULTURE, GROUP A STREP (THRC)

## 2018-09-14 ENCOUNTER — Telehealth (HOSPITAL_COMMUNITY): Payer: Self-pay

## 2018-09-14 NOTE — Telephone Encounter (Signed)
Mother reports that she has had Mastitis in her Rt breast 2 weeks ago.  She took meds that were given to her at urgent care.  Mother reports that Dr told her to only take meds until pain stops.  Mother states that she thinks that she has Mastitis in the left breast now.  Mother reports that her left breast feels warm but no redness notes. She reports pain in the breast.   Mother also reports that the left breast feels hard and she can only pump 2-3 ounces when she was pumping 7 ounces.   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 Mother also reports that when she pumped last today that she pumped lite pink blood from her breast.   Mother was advised to follow up with her provider and allow to assess her breast. Mother denies having a fever.  She does state that infant has now started bitting her.  Mother given tips to deter infant from bitting.   Advised mother to continue to pump regularly and cue base feed infant when at home. Advised mother to go to a quieter room for breastfeeding so infant will not be distracted.  Mother receptive to plan of care.

## 2018-09-16 ENCOUNTER — Ambulatory Visit: Payer: Self-pay | Admitting: Certified Nurse Midwife

## 2018-09-22 ENCOUNTER — Encounter: Payer: BC Managed Care – PPO | Admitting: Obstetrics and Gynecology

## 2018-10-06 ENCOUNTER — Ambulatory Visit (INDEPENDENT_AMBULATORY_CARE_PROVIDER_SITE_OTHER): Payer: BC Managed Care – PPO | Admitting: Certified Nurse Midwife

## 2018-10-06 ENCOUNTER — Encounter: Payer: Self-pay | Admitting: Certified Nurse Midwife

## 2018-10-06 VITALS — BP 110/74 | HR 83 | Ht 62.0 in | Wt 171.6 lb

## 2018-10-06 DIAGNOSIS — Z01419 Encounter for gynecological examination (general) (routine) without abnormal findings: Secondary | ICD-10-CM | POA: Diagnosis not present

## 2018-10-06 DIAGNOSIS — Z6831 Body mass index (BMI) 31.0-31.9, adult: Secondary | ICD-10-CM

## 2018-10-06 DIAGNOSIS — Z23 Encounter for immunization: Secondary | ICD-10-CM

## 2018-10-06 DIAGNOSIS — R5383 Other fatigue: Secondary | ICD-10-CM

## 2018-10-06 NOTE — Progress Notes (Signed)
ANNUAL PREVENTATIVE CARE GYN  ENCOUNTER NOTE  Subjective:       Sierra Clay is a 28 y.o. [phone] female here for a routine annual gynecologic exam.  Current complaints: 1. Here to establish care 2. Desires ANNUAL labs 3. Needs IUD string check  Reports fatigue and history of vitamin D deficiency. 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   Denies difficulty breathing or respiratory distress, chest pain, abdominal pain, excessive vaginal bleeding, dysuria, and leg pain or swelling.    Gynecologic History  No LMP recorded. (Menstrual status: IUD).  Contraception: IUD, Mirena  Last Pap: 05/2017. Results were: normal  Obstetric History  OB History  Gravida Para Term Preterm AB Living  3 2 2   1 2   SAB TAB Ectopic Multiple Live Births  1     0 2    # Outcome Date GA Lbr Len/2nd Weight Sex Delivery Anes PTL Lv  3 Term 12/26/17 [redacted]w[redacted]d / 01:53 7 lb 4.4 oz (3.3 kg) M Vag-Vacuum EPI  LIV  2 Term 01/05/10   7 lb 2 oz (3.232 kg) F Vag-Spont  N LIV  1 SAB             Past Medical History:  Diagnosis Date  . Asthma    mild  . History of abnormal cervical Pap smear 2012, 2013, 2016    Past Surgical History:  Procedure Laterality Date  . NO PAST SURGERIES      Current Outpatient Medications on File Prior to Visit  Medication Sig Dispense Refill  . ferrous sulfate 325 (65 FE) MG tablet Take 325 mg by mouth daily with breakfast.    . ibuprofen (ADVIL,MOTRIN) 600 MG tablet Take 1 tablet (600 mg total) by mouth every 6 (six) hours. (Patient not taking: Reported on 01/11/2018) 30 tablet 0  . oseltamivir (TAMIFLU) 75 MG capsule Take 1 capsule (75 mg total) by mouth every 12 (twelve) hours. 10 capsule 0   Current Facility-Administered Medications on File Prior to Visit  Medication Dose Route Frequency Provider Last Rate Last Dose  . levonorgestrel (MIRENA) 20 MCG/24HR IUD   Intrauterine Once Schuman, Christanna R, MD        No Known  Allergies  Social History   Socioeconomic History  . Marital status: Single    Spouse name: Not on file  . Number of children: Not on file  . Years of education: Not on file  . Highest education level: Not on file  Occupational History  . Not on file  Social Needs  . Financial resource strain: Not on file  . Food insecurity:    Worry: Not on file    Inability: Not on file  . Transportation needs:    Medical: Not on file    Non-medical: Not on file  Tobacco Use  . Smoking status: Never Smoker  . Smokeless tobacco: Never Used  Substance and Sexual Activity  . Alcohol use: No  . Drug use: No  . Sexual activity: Yes    Birth control/protection: None, I.U.D.  Lifestyle  . Physical activity:    Days per week: Not on file    Minutes per session: Not on file  . Stress: Not on file  Relationships  . Social connections:    Talks on phone: Not on file    Gets together: Not on file    Attends religious service: Not on file    Active member of club or organization: Not on file  Attends meetings of clubs or organizations: Not on file    Relationship status: Not on file  . Intimate partner violence:    Fear of current or ex partner: Not on file    Emotionally abused: Not on file    Physically abused: Not on file    Forced sexual activity: Not on file  Other Topics Concern  . Not on file  Social History Narrative  . Not on file    Family History  Problem Relation Age of Onset  . Diabetes Paternal Grandmother   . Cancer Maternal Grandmother     The following portions of the patient's history were reviewed and updated as appropriate: allergies, current medications, past family history, past medical history, past social history, past surgical history and problem list.  Review of Systems  ROS negative except as noted above. Information obtained from patient.   Objective:   BP 110/74   Pulse 83   Ht  (1.575 m)   Wt 171 lb 9 oz (77.8 kg)   Breastfeeding Yes    BMI 31.38 kg/m    CONSTITUTIONAL: Well-developed, well-nourished female in no acute distress.   PSYCHIATRIC: Normal mood and affect. Normal behavior. Normal judgment and thought content.  NEUROLGIC: Alert and oriented to person, place, and time. Normal muscle tone coordination. No cranial nerve deficit noted.  HENT:  Normocephalic, atraumatic, External right and left ear normal.   EYES: Conjunctivae and EOM are normal. Pupils are equal and round.   NECK: Normal range of motion, supple, no masses.  Normal thyroid.   SKIN: Skin is warm and dry. No rash noted. Not diaphoretic. No erythema. No pallor.  CARDIOVASCULAR: Normal heart rate noted, regular rhythm, no murmur.  RESPIRATORY: Clear to auscultation bilaterally. Effort and breath sounds normal, no problems with respiration noted.  BREASTS: Symmetric in size. No masses, skin changes, nipple drainage, or lymphadenopathy.  ABDOMEN: Soft, normal bowel sounds, no distention noted.  No tenderness, rebound or guarding. Obese.   PELVIC:  External Genitalia: Normal  Vagina: Normal  Cervix: Normal, IUD strings present  Uterus: Normal  Adnexa: Normal  MUSCULOSKELETAL: Normal range of motion. No tenderness.  No cyanosis, clubbing, or edema.  2+ distal pulses.  LYMPHATIC: No Axillary, Supraclavicular, or Inguinal Adenopathy.  Assessment:   Annual gynecologic examination 28 y.o.   Contraception: IUD, Mirena  Obesity 1   Problem List Items Addressed This Visit    None    Visit Diagnoses    Well woman exam    -  Primary   Relevant Orders   CBC   Thyroid Panel With TSH   Lipid panel   Comprehensive metabolic panel   Hemoglobin A1c   VITAMIN D 25 Hydroxy (Vit-D Deficiency, Fractures)   Lactating mother       Relevant Orders   Thyroid Panel With TSH   VITAMIN D 25 Hydroxy (Vit-D Deficiency, Fractures)   Other fatigue       Relevant Orders   Thyroid Panel With TSH   BMI 31.0-31.9,adult       Relevant Orders   Thyroid  Panel With TSH   Lipid panel   Comprehensive metabolic panel   Hemoglobin A1c      Plan:   Pap: Not needed  Labs: See orders   Routine preventative health maintenance measures emphasized: Exercise/Diet/Weight control, Tobacco Warnings, Alcohol/Substance use risks and Stress Management; see AVS  Handout given regarding herb use in lactation  Reviewed red flag symptoms and when to call  RTC x  1 year for ANNUAL EXAM or sooner if needed   Gunnar Bulla, CNM [facility], [facility] 10/06/18 4:50 PM

## 2018-10-06 NOTE — Patient Instructions (Addendum)
WE WOULD LOVE TO HEAR FROM YOU!!!!   Thank you Sierra Clay for visiting [facility].  Providing our patients with the best experience possible is really important to [place], and we hope that you felt that on your recent visit. The most valuable feedback we get comes from [place]!!    If you receive a survey please take a couple of minutes to let us know how we did.Thank you for continuing to trust [place] with your care.   [facility]   Preventive Care 18-39 Years, Female Preventive care refers to lifestyle choices and visits with your health care provider that can promote health and wellness. What does preventive care include?   A yearly physical exam. This is also called an annual well check.  Dental exams once or twice a year.  Routine eye exams. Ask your health care provider how often you should have your eyes checked.  Personal lifestyle choices, including: ? Daily care of your teeth and gums. ? Regular physical activity. ? Eating a healthy diet. ? Avoiding tobacco and drug use. ? Limiting alcohol use. ? Practicing safe sex. ? Taking vitamin and mineral supplements as recommended by your health care provider. What happens during an annual well check? The services and screenings done by your health care provider during your annual well check will depend on your age, overall health, lifestyle risk factors, and family history of disease. Counseling Your health care provider may ask you questions about your:  Alcohol use.  Tobacco use.  Drug use.  Emotional well-being.  Home and relationship well-being.  Sexual activity.  Eating habits.  Work and work Statistician.  Method of birth control.  Menstrual cycle.  Pregnancy history. Screening You may have the following tests or measurements:  Height, weight, and BMI.  Diabetes screening. This is done by checking your blood sugar (glucose) after you have not eaten for a while  (fasting).  Blood pressure.  Lipid and cholesterol levels. These may be checked every 5 years starting at age 72.  Skin check.  Hepatitis C blood test.  Hepatitis B blood test.  Sexually transmitted disease (STD) testing.  BRCA-related cancer screening. This may be done if you have a family history of breast, ovarian, tubal, or peritoneal cancers.  Pelvic exam and Pap test. This may be done every 3 years starting at age 37. Starting at age 92, this may be done every 5 years if you have a Pap test in combination with an HPV test. Discuss your test results, treatment options, and if necessary, the need for more tests with your health care provider. Vaccines Your health care provider may recommend certain vaccines, such as:  Influenza vaccine. This is recommended every year.  Tetanus, diphtheria, and acellular pertussis (Tdap, Td) vaccine. You may need a Td booster every 10 years.  Varicella vaccine. You may need this if you have not been vaccinated.  HPV vaccine. If you are 59 or younger, you may need three doses over 6 months.  Measles, mumps, and rubella (MMR) vaccine. You may need at least one dose of MMR. You may also need a second dose.  Pneumococcal 13-valent conjugate (PCV13) vaccine. You may need this if you have certain conditions and were not previously vaccinated.  Pneumococcal polysaccharide (PPSV23) vaccine. You may need one or two doses if you smoke cigarettes or if you have certain conditions.  Meningococcal vaccine. One dose is recommended if you are age 48-21 years and a Market researcher living in  a residence hall, or if you have one of several medical conditions. You may also need additional booster doses.  Hepatitis A vaccine. You may need this if you have certain conditions or if you travel or work in places where you may be exposed to hepatitis A.  Hepatitis B vaccine. You may need this if you have certain conditions or if you travel or work in  places where you may be exposed to hepatitis B.  Haemophilus influenzae type b (Hib) vaccine. You may need this if you have certain risk factors. Talk to your health care provider about which screenings and vaccines you need and how often you need them. This information is not intended to replace advice given to you by your health care provider. Make sure you discuss any questions you have with your health care provider. Document Released: 09/15/2001 Document Revised: 03/02/2017 Document Reviewed: 05/21/2015 Elsevier Interactive Patient Education  2019 [employer]. Levonorgestrel intrauterine device (IUD) What is this medicine? LEVONORGESTREL IUD (LEE voe nor jes trel) is a contraceptive (birth control) device. The device is placed inside the uterus by a healthcare professional. It is used to prevent pregnancy. This device can also be used to treat heavy bleeding that occurs during your period. This medicine may be used for other purposes; ask your health care provider or pharmacist if you have questions. COMMON BRAND NAME(S): Minette Headland What should I tell my health care provider before I take this medicine? They need to know if you have any of these conditions: -abnormal Pap smear -cancer of the breast, uterus, or cervix -diabetes -endometritis -genital or pelvic infection now or in the past -have more than one sexual partner or your partner has more than one partner -heart disease -history of an ectopic or tubal pregnancy -immune system problems -IUD in place -liver disease or tumor -problems with blood clots or take blood-thinners -seizures -use intravenous drugs -uterus of unusual shape -vaginal bleeding that has not been explained -an unusual or allergic reaction to levonorgestrel, other hormones, silicone, or polyethylene, medicines, foods, dyes, or preservatives -pregnant or trying to get pregnant -breast-feeding How should I use this medicine? This  device is placed inside the uterus by a health care professional. Talk to your pediatrician regarding the use of this medicine in children. Special care may be needed. Overdosage: If you think you have taken too much of this medicine contact a poison control center or emergency room at once. NOTE: This medicine is only for you. Do not share this medicine with others. What if I miss a dose? This does not apply. Depending on the brand of device you have inserted, the device will need to be replaced every 3 to 5 years if you wish to continue using this type of birth control. What may interact with this medicine? Do not take this medicine with any of the following medications: -amprenavir -bosentan -fosamprenavir This medicine may also interact with the following medications: -aprepitant -armodafinil -barbiturate medicines for inducing sleep or treating seizures -bexarotene -boceprevir -griseofulvin -medicines to treat seizures like carbamazepine, ethotoin, felbamate, oxcarbazepine, phenytoin, topiramate -modafinil -pioglitazone -rifabutin -rifampin -rifapentine -some medicines to treat HIV infection like atazanavir, efavirenz, indinavir, lopinavir, nelfinavir, tipranavir, ritonavir -St. John's wort -warfarin This list may not describe all possible interactions. Give your health care provider a list of all the medicines, herbs, non-prescription drugs, or dietary supplements you use. Also tell them if you smoke, drink alcohol, or use illegal drugs. Some items may interact with your medicine. What  should I watch for while using this medicine? Visit your doctor or health care professional for regular check ups. See your doctor if you or your partner has sexual contact with others, becomes HIV positive, or gets a sexual transmitted disease. This product does not protect you against HIV infection (AIDS) or other sexually transmitted diseases. You can check the placement of the IUD yourself by  reaching up to the top of your vagina with clean fingers to feel the threads. Do not pull on the threads. It is a good habit to check placement after each menstrual period. Call your doctor right away if you feel more of the IUD than just the threads or if you cannot feel the threads at all. The IUD may come out by itself. You may become pregnant if the device comes out. If you notice that the IUD has come out use a backup birth control method like condoms and call your health care provider. Using tampons will not change the position of the IUD and are okay to use during your period. This IUD can be safely scanned with magnetic resonance imaging (MRI) only under specific conditions. Before you have an MRI, tell your healthcare provider that you have an IUD in place, and which type of IUD you have in place. What side effects may I notice from receiving this medicine? Side effects that you should report to your doctor or health care professional as soon as possible: -allergic reactions like skin rash, itching or hives, swelling of the face, lips, or tongue -fever, flu-like symptoms -genital sores -high blood pressure -no menstrual period for 6 weeks during use -pain, swelling, warmth in the leg -pelvic pain or tenderness -severe or sudden headache -signs of pregnancy -stomach cramping -sudden shortness of breath -trouble with balance, talking, or walking -unusual vaginal bleeding, discharge -yellowing of the eyes or skin Side effects that usually do not require medical attention (report to your doctor or health care professional if they continue or are bothersome): -acne -breast pain -change in sex drive or performance -changes in weight -cramping, dizziness, or faintness while the device is being inserted -headache -irregular menstrual bleeding within first 3 to 6 months of use -nausea This list may not describe all possible side effects. Call your doctor for medical advice about side  effects. You may report side effects to FDA at 1-800-FDA-1088. Where should I keep my medicine? This does not apply. NOTE: This sheet is a summary. It may not cover all possible information. If you have questions about this medicine, talk to your doctor, pharmacist, or health care provider.  2019 Elsevier/Gold Standard (2016-05-01 14:14:56)

## 2018-10-07 LAB — COMPREHENSIVE METABOLIC PANEL
ALBUMIN: 4.3 g/dL (ref 3.9–5.0)
ALK PHOS: 146 IU/L — AB (ref 39–117)
ALT: 16 IU/L (ref 0–32)
AST: 18 IU/L (ref 0–40)
Albumin/Globulin Ratio: 1.5 (ref 1.2–2.2)
BILIRUBIN TOTAL: 0.2 mg/dL (ref 0.0–1.2)
BUN / CREAT RATIO: 28 — AB (ref 9–23)
BUN: 18 mg/dL (ref 6–20)
CALCIUM: 9.5 mg/dL (ref 8.7–10.2)
CO2: 23 mmol/L (ref 20–29)
CREATININE: 0.65 mg/dL (ref 0.57–1.00)
Chloride: 102 mmol/L (ref 96–106)
GFR calc Af Amer: 140 mL/min/{1.73_m2} (ref >59)
GFR calc non Af Amer: 121 mL/min/{1.73_m2} (ref >59)
GLOBULIN, TOTAL: 2.8 g/dL (ref 1.5–4.5)
GLUCOSE: 88 mg/dL (ref 65–99)
Potassium: 4.4 mmol/L (ref 3.5–5.2)
SODIUM: 139 mmol/L (ref 134–144)
TOTAL PROTEIN: 7.1 g/dL (ref 6.0–8.5)

## 2018-10-07 LAB — THYROID PANEL WITH TSH
Free Thyroxine Index: 1.2 (ref 1.2–4.9)
T3 UPTAKE RATIO: 28 % (ref 24–39)
T4 TOTAL: 4.4 ug/dL — AB (ref 4.5–12.0)
TSH: 1.64 u[IU]/mL (ref 0.450–4.500)

## 2018-10-07 LAB — HEMOGLOBIN A1C
Est. average glucose Bld gHb Est-mCnc: 105 mg/dL
HEMOGLOBIN A1C: 5.3 % (ref 4.8–5.6)

## 2018-10-07 LAB — CBC
HEMOGLOBIN: 13.7 g/dL (ref 11.1–15.9)
Hematocrit: 41.9 % (ref 34.0–46.6)
MCH: 29.4 pg (ref 26.6–33.0)
MCHC: 32.7 g/dL (ref 31.5–35.7)
MCV: 90 fL (ref 79–97)
Platelets: 277 10*3/uL (ref 150–450)
RBC: 4.66 x10E6/uL (ref 3.77–5.28)
RDW: 13.5 % (ref 11.7–15.4)
WBC: 7.6 10*3/uL (ref 3.4–10.8)

## 2018-10-07 LAB — VITAMIN D 25 HYDROXY (VIT D DEFICIENCY, FRACTURES): Vit D, 25-Hydroxy: 15.7 ng/mL — ABNORMAL LOW (ref 30.0–100.0)

## 2018-10-07 LAB — LIPID PANEL
Chol/HDL Ratio: 3.5 ratio (ref 0.0–4.4)
Cholesterol, Total: 239 mg/dL — ABNORMAL HIGH (ref 100–199)
HDL: 68 mg/dL (ref >39)
LDL Calculated: 156 mg/dL — ABNORMAL HIGH (ref 0–99)
Triglycerides: 75 mg/dL (ref 0–149)
VLDL CHOLESTEROL CAL: 15 mg/dL (ref 5–40)

## 2018-10-10 ENCOUNTER — Encounter: Payer: Self-pay | Admitting: Certified Nurse Midwife

## 2018-10-11 ENCOUNTER — Other Ambulatory Visit: Payer: Self-pay | Admitting: *Deleted

## 2018-10-11 MED ORDER — VITAMIN D (ERGOCALCIFEROL) 1.25 MG (50000 UNIT) PO CAPS: 50000.0000 [IU] | ORAL_CAPSULE | ORAL | 4 refills | Status: DC

## 2018-10-24 ENCOUNTER — Ambulatory Visit: Payer: BC Managed Care – PPO | Admitting: Family Medicine

## 2018-10-24 ENCOUNTER — Encounter: Payer: Self-pay | Admitting: Family Medicine

## 2018-10-24 ENCOUNTER — Other Ambulatory Visit: Payer: Self-pay

## 2018-10-24 VITALS — BP 102/60 | HR 93 | Temp 97.8°F | Resp 16 | Ht 62.0 in | Wt 170.5 lb

## 2018-10-24 DIAGNOSIS — E559 Vitamin D deficiency, unspecified: Secondary | ICD-10-CM

## 2018-10-24 DIAGNOSIS — E669 Obesity, unspecified: Secondary | ICD-10-CM | POA: Diagnosis not present

## 2018-10-24 DIAGNOSIS — B360 Pityriasis versicolor: Secondary | ICD-10-CM

## 2018-10-24 DIAGNOSIS — E7800 Pure hypercholesterolemia, unspecified: Secondary | ICD-10-CM | POA: Diagnosis not present

## 2018-10-24 DIAGNOSIS — Z8709 Personal history of other diseases of the respiratory system: Secondary | ICD-10-CM | POA: Insufficient documentation

## 2018-10-24 MED ORDER — KETOCONAZOLE 2 % EX CREA: 1.0000 "application " | TOPICAL_CREAM | Freq: Two times a day (BID) | CUTANEOUS | 0 refills | Status: DC

## 2018-10-24 NOTE — Addendum Note (Signed)
Addended by: Cynda Familia on: 10/24/2018 10:13 AM   Modules accepted: Orders

## 2018-10-24 NOTE — Patient Instructions (Signed)

## 2018-10-24 NOTE — Progress Notes (Signed)
Name: Sierra Clay   MRN: [id number]    DOB: [birth year]   Date:10/24/2018       Progress Note  Subjective  Chief Complaint  Chief Complaint  Patient presents with  . Establish Care    Would like to discuss discoloration of her neck after having her son-10 month ago  . Asthma    Well controlled  . Vitamin D Deficiency    Takes the 50,000 units weekly    HPI  History of asthma: she states she had asthma as an infant and also while playing sports while in middle and high school, however currently no symptoms, denies cough, SOB or wheezing, spirometry today was normal.   Vitamin D deficiency: seen by Dr. Valentino Saxon, reviewed labs and vitamin D was very low advised to take vitamin D otc to avoid levels dropping again  Hyperlipidemia: LDL was 156 in March 2020, discussed life style modification, HDL was good, normal triglycerides not other risk factors we will not treat her at this time with medication. No significant family history of sudden death, heart attacks or strokes  Tinea versicolor : she states started when she was pregnant, and has not resolved since son was born 10 months ago, hyperpigmentation that gets flaky on her neck and upper chest. No pruritis. She has not tried any medication. She is currently nursing and terbinafine is not indicated    Past Surgical History:  Procedure Laterality Date  . NO PAST SURGERIES      Family History  Problem Relation Age of Onset  . Diabetes Paternal Grandmother   . Cancer Maternal Grandmother   . Hypercholesterolemia Mother   . Hypercholesterolemia Father     Social History   Socioeconomic History  . Marital status: Single    Spouse name: Not on file  . Number of children: 2  . Years of education: Not on file  . Highest education level: Bachelor's degree (e.g., BA, AB, BS)  Occupational History  . Not on file  Social Needs  . Financial resource strain: Not hard at all  . Food insecurity:    Worry: Never true    Inability:  Never true  . Transportation needs:    Medical: No    Non-medical: No  Tobacco Use  . Smoking status: Never Smoker  . Smokeless tobacco: Never Used  Substance and Sexual Activity  . Alcohol use: No  . Drug use: No  . Sexual activity: Yes    Partners: Male    Birth control/protection: None, I.U.D.  Lifestyle  . Physical activity:    Days per week: 3 days    Minutes per session: 30 min  . Stress: Not at all  Relationships  . Social connections:    Talks on phone: More than three times a week    Gets together: Three times a week    Attends religious service: 1 to 4 times per year    Active member of club or organization: No    Attends meetings of clubs or organizations: Never    Relationship status: Living with partner  . Intimate partner violence:    Fear of current or ex partner: No    Emotionally abused: No    Physically abused: No    Forced sexual activity: No  Other Topics Concern  . Not on file  Social History Narrative  . Not on file     Current Outpatient Medications:  Marland Kitchen  Vitamin D, Ergocalciferol, (DRISDOL) 1.25 MG (50000 UT) CAPS capsule, Take  1 capsule (50,000 Units total) by mouth every 7 (seven) days., Disp: 12 capsule, Rfl: 4  Current Facility-Administered Medications:  .  levonorgestrel (MIRENA) 20 MCG/24HR IUD, , Intrauterine, Once, Schuman, Christanna R, MD  No Known Allergies  I personally reviewed active problem list, medication list, allergies, family history, social history with the patient/caregiver today.   ROS  Ten systems reviewed and is negative except as mentioned in HPI   Objective  Vitals:   10/24/18 0931  BP: 102/60  Pulse: 93  Resp: 16  Temp: 97.8 F (36.6 C)  TempSrc: Oral  SpO2: 99%  Weight: 170 lb 8 oz (77.3 kg)  Height:  (1.575 m)    Body mass index is 31.18 kg/m.  Physical Exam  Constitutional: Patient appears well-developed and well-nourished. Obese  No distress.  HEENT: head atraumatic, normocephalic,  pupils equal and reactive to light,  neck supple, throat within normal limits Cardiovascular: Normal rate, regular rhythm and normal heart sounds.  No murmur heard. No BLE edema. Pulmonary/Chest: Effort normal and breath sounds normal. No respiratory distress. Abdominal: Soft.  There is no tenderness. Skin: hyperpigmented rash on neck and upper chest, mild desquamation, no erythema   Psychiatric: Patient has a normal mood and affect. behavior is normal. Judgment and thought content normal.  Recent Results (from the past 2160 hour(s))  Rapid Influenza A&B Antigens ([facility] only)     Status: Abnormal   Collection Time: 08/21/18 12:32 PM  Result Value Ref Range   Influenza A ([facility]) POSITIVE (A) NEGATIVE   Influenza B ([facility]) NEGATIVE NEGATIVE    Comment: Performed at [facility], [address], [place], [place] [address]  Rapid Strep Screen ([facility] ONLY)     Status: None   Collection Time: 08/21/18 12:32 PM  Result Value Ref Range   Streptococcus, Group A Screen (Direct) NEGATIVE NEGATIVE    Comment: (NOTE) A Rapid Antigen test may result negative if the antigen level in the sample is below the detection level of this test. The FDA has not cleared this test as a stand-alone test therefore the rapid antigen negative result has reflexed to a Group A Strep culture. Performed at [facility], [address], [place], [place] [address]   Culture, group A strep     Status: None   Collection Time: 08/21/18 12:32 PM  Result Value Ref Range   Specimen Description THROAT    Special Requests      NONE Reflexed from [id number] Performed at [facility], [address], [place], [place] [address]    Culture NO GROUP A STREP (S.PYOGENES) ISOLATED    Report Status 08/24/2018 FINAL   CBC     Status: None   Collection Time: 10/06/18  9:42 AM  Result Value Ref Range   WBC 7.6 3.4 - 10.8 x10E3/uL   RBC 4.66 3.77 - 5.28 x10E6/uL   Hemoglobin 13.7 11.1 - 15.9  g/dL   Hematocrit 84.1 32.4 - 46.6 %   MCV 90 79 - 97 fL   MCH 29.4 26.6 - 33.0 pg   MCHC 32.7 31.5 - 35.7 g/dL   RDW 40.1 02.7 - 25.3 %   Platelets 277 150 - 450 x10E3/uL  Thyroid Panel With TSH     Status: Abnormal   Collection Time: 10/06/18  9:42 AM  Result Value Ref Range   TSH 1.640 0.450 - 4.500 uIU/mL   T4, Total 4.4 (L) 4.5 - 12.0 ug/dL   T3 Uptake  Ratio 28 24 - 39 %   Free Thyroxine Index 1.2 1.2 - 4.9  Lipid panel     Status: Abnormal   Collection Time: 10/06/18  9:42 AM  Result Value Ref Range   Cholesterol, Total 239 (H) 100 - 199 mg/dL   Triglycerides 75 0 - 149 mg/dL   HDL 68 >20 mg/dL   VLDL Cholesterol Cal 15 5 - 40 mg/dL   LDL Calculated 802 (H) 0 - 99 mg/dL   Chol/HDL Ratio 3.5 0.0 - 4.4 ratio    Comment:                                   T. Chol/HDL Ratio                                             Men  Women                               1/2 Avg.Risk  3.4    3.3                                   Avg.Risk  5.0    4.4                                2X Avg.Risk  9.6    7.1                                3X Avg.Risk 23.4   11.0   Comprehensive metabolic panel     Status: Abnormal   Collection Time: 10/06/18  9:42 AM  Result Value Ref Range   Glucose 88 65 - 99 mg/dL   BUN 18 6 - 20 mg/dL   Creatinine, Ser 2.33 0.57 - 1.00 mg/dL   GFR calc non Af Amer 121 >59 mL/min/1.73   GFR calc Af Amer 140 >59 mL/min/1.73   BUN/Creatinine Ratio 28 (H) 9 - 23   Sodium 139 134 - 144 mmol/L   Potassium 4.4 3.5 - 5.2 mmol/L   Chloride 102 96 - 106 mmol/L   CO2 23 20 - 29 mmol/L   Calcium 9.5 8.7 - 10.2 mg/dL   Total Protein 7.1 6.0 - 8.5 g/dL   Albumin 4.3 3.9 - 5.0 g/dL   Globulin, Total 2.8 1.5 - 4.5 g/dL   Albumin/Globulin Ratio 1.5 1.2 - 2.2   Bilirubin Total 0.2 0.0 - 1.2 mg/dL   Alkaline Phosphatase 146 (H) 39 - 117 IU/L   AST 18 0 - 40 IU/L   ALT 16 0 - 32 IU/L  Hemoglobin A1c     Status: None   Collection Time: 10/06/18  9:42 AM  Result Value Ref Range    Hgb A1c MFr Bld 5.3 4.8 - 5.6 %    Comment:          Prediabetes: 5.7 - 6.4          Diabetes: >6.4          Glycemic control for adults with diabetes: <7.0  Est. average glucose Bld gHb Est-mCnc 105 mg/dL  VITAMIN D 25 Hydroxy (Vit-D Deficiency, Fractures)     Status: Abnormal   Collection Time: 10/06/18  9:42 AM  Result Value Ref Range   Vit D, 25-Hydroxy 15.7 (L) 30.0 - 100.0 ng/mL    Comment: Vitamin D deficiency has been defined by the Institute of Medicine and an Endocrine Society practice guideline as a level of serum 25-OH vitamin D less than 20 ng/mL (1,2). The Endocrine Society went on to further define vitamin D insufficiency as a level between 21 and 29 ng/mL (2). 1. IOM (Institute of Medicine). 2010. Dietary reference    intakes for calcium and D. [place]: The    [employer]. 2. Holick MF, Binkley , Bischoff-Ferrari HA, et al.    Evaluation, treatment, and prevention of vitamin D    deficiency: an Endocrine Society clinical practice    guideline. JCEM. 2011 Jul; 96(7):1911-30.      PHQ2/9: Depression screen [facility] 2/9 10/24/2018  Decreased Interest 0  Down, Depressed, Hopeless 0  PHQ - 2 Score 0  Altered sleeping 0  Tired, decreased energy 0  Change in appetite 0  Feeling bad or failure about yourself  0  Trouble concentrating 0  Moving slowly or fidgety/restless 0  Suicidal thoughts 0  PHQ-9 Score 0  Difficult doing work/chores Not difficult at all     Fall Risk: Fall Risk  10/24/2018  Falls in the past year? 0  Number falls in past yr: 0  Injury with Fall? 0     Functional Status Survey: Is the patient deaf or have difficulty hearing?: No Does the patient have difficulty seeing, even when wearing glasses/contacts?: Yes Does the patient have difficulty concentrating, remembering, or making decisions?: No Does the patient have difficulty walking or climbing stairs?: No Does the patient have difficulty dressing or bathing?:  No Does the patient have difficulty doing errands alone such as visiting a doctor's office or shopping?: No    Assessment & Plan  1. Pure hypercholesterolemia  Discussed life style modification   2. History of asthma  Normal spirometry today   3. Obesity (BMI 30.0-34.9)  Discussed with the patient the risk posed by an increased BMI. Discussed importance of portion control, calorie counting and at least 150 minutes of physical activity weekly. Avoid sweet beverages and drink more water. Eat at least 6 servings of fruit and vegetables daily   4. Vitamin D deficiency  Start vitamin D otc once she finishes rx vitamin D   5. Tinea versicolor  - ketoconazole (NIZORAL) 2 % cream; Apply 1 application topically 2 (two) times daily.  Dispense: 15 g; Refill: 0

## 2018-11-17 ENCOUNTER — Other Ambulatory Visit: Payer: Self-pay | Admitting: Family Medicine

## 2018-11-17 DIAGNOSIS — B360 Pityriasis versicolor: Secondary | ICD-10-CM

## 2018-11-17 NOTE — Telephone Encounter (Signed)
  Requested Prescriptions  Pending Prescriptions Disp Refills   ketoconazole (NIZORAL) 2 % cream 15 g 0    Sig: Apply 1 application topically 2 (two) times daily.     There is no refill protocol information for this order

## 2018-11-20 ENCOUNTER — Other Ambulatory Visit: Payer: Self-pay | Admitting: Family Medicine

## 2018-11-20 DIAGNOSIS — B360 Pityriasis versicolor: Secondary | ICD-10-CM

## 2018-11-21 MED ORDER — KETOCONAZOLE 2 % EX CREA: 1.0000 "application " | TOPICAL_CREAM | Freq: Two times a day (BID) | CUTANEOUS | 0 refills | Status: DC

## 2019-03-05 ENCOUNTER — Encounter: Payer: Self-pay | Admitting: Family Medicine

## 2019-04-06 ENCOUNTER — Encounter: Payer: Self-pay | Admitting: Family Medicine

## 2019-04-07 ENCOUNTER — Other Ambulatory Visit: Payer: Self-pay

## 2019-04-07 ENCOUNTER — Ambulatory Visit
Admission: EM | Admit: 2019-04-07 | Discharge: 2019-04-07 | Disposition: A | Discharge status: Home / Self Care | Payer: BC Managed Care – PPO | Attending: Family Medicine | Admitting: Family Medicine

## 2019-04-07 ENCOUNTER — Encounter: Payer: Self-pay | Admitting: Emergency Medicine

## 2019-04-07 DIAGNOSIS — N390 Urinary tract infection, site not specified: Secondary | ICD-10-CM

## 2019-04-07 DIAGNOSIS — R319 Hematuria, unspecified: Secondary | ICD-10-CM

## 2019-04-07 DIAGNOSIS — R300 Dysuria: Secondary | ICD-10-CM | POA: Diagnosis not present

## 2019-04-07 LAB — URINALYSIS, COMPLETE (UACMP) WITH MICROSCOPIC
Bilirubin Urine: NEGATIVE
Glucose, UA: NEGATIVE mg/dL
Ketones, ur: NEGATIVE mg/dL
Nitrite: NEGATIVE
Protein, ur: 30 mg/dL — AB
Specific Gravity, Urine: 1.02 (ref 1.005–1.030)
pH: 7 (ref 5.0–8.0)

## 2019-04-07 MED ORDER — CEPHALEXIN 500 MG PO CAPS: 500.0000 mg | ORAL_CAPSULE | Freq: Two times a day (BID) | ORAL | 0 refills | Status: DC

## 2019-04-07 NOTE — ED Provider Notes (Signed)
[facility]    CSN: [id number] Arrival date & time: 04/07/19  1633      History   Chief Complaint Chief Complaint  Patient presents with  . Dysuria    HPI Sierra Clay is a 28 y.o. female.   28 yo female with a c/o burning and discomfort with urination for the past 5 days. Denies any vomiting, fevers, chills, hematuria, vaginal discharge.    Dysuria   Past Medical History:  Diagnosis Date  . Asthma    Mild  . History of abnormal cervical Pap smear 2012, 2013, 2016  . Hypercholesteremia     Patient Active Problem List   Diagnosis Date Noted  . History of asthma 10/24/2018  . Pure hypercholesterolemia 10/24/2018  . Obesity (BMI 30.0-34.9) 10/24/2018  . Vitamin D deficiency 10/24/2018    Past Surgical History:  Procedure Laterality Date  . NO PAST SURGERIES      OB History    Gravida  3   Para  2   Term  2   Preterm      AB  1   Living  2     SAB  1   TAB      Ectopic      Multiple  0   Live Births  2            Home Medications    Prior to Admission medications   Medication Sig Start Date End Date Taking? Authorizing Provider  Vitamin D, Ergocalciferol, (DRISDOL) 1.25 MG (50000 UT) CAPS capsule Take 1 capsule (50,000 Units total) by mouth every 7 (seven) days. 10/11/18  Yes Lawhorn, Lara Mulch, CNM  cephALEXin (KEFLEX) 500 MG capsule Take 1 capsule (500 mg total) by mouth 2 (two) times daily. 04/07/19   Norval Gable, MD  ketoconazole (NIZORAL) 2 % cream Apply 1 application topically 2 (two) times daily. 11/21/18   Steele Sizer, MD    Family History Family History  Problem Relation Age of Onset  . Diabetes Paternal Grandmother   . Cancer Maternal Grandmother   . Hypercholesterolemia Mother   . Hypercholesterolemia Father     Social History Social History   Tobacco Use  . Smoking status: Never Smoker  . Smokeless tobacco: Never Used  Substance Use Topics  . Alcohol use: No  . Drug use: No      Allergies   Patient has no known allergies.   Review of Systems Review of Systems  Genitourinary: Positive for dysuria.     Physical Exam Triage Vital Signs ED Triage Vitals  Enc Vitals Group     BP 04/07/19 1646 124/85     Pulse Rate 04/07/19 1646 98     Resp 04/07/19 1646 14     Temp 04/07/19 1646 98.1 F (36.7 C)     Temp Source 04/07/19 1646 Oral     SpO2 04/07/19 1646 99 %     Weight 04/07/19 1643 172 lb (78 kg)     Height 04/07/19 1643 5\' 3"  (1.6 m)     Head Circumference --      Peak Flow --      Pain Score 04/07/19 1643 0     Pain Loc --      Pain Edu? --      Excl. in [place]? --    No data found.  Updated Vital Signs BP 124/85 (BP Location: Right Arm)   Pulse 98   Temp 98.1 F (36.7 C) (Oral)   Resp  14   Ht 5\' 3"  (1.6 m)   Wt 78 kg   SpO2 99%   Breastfeeding Yes   BMI 30.47 kg/m   Visual Acuity Right Eye Distance:   Left Eye Distance:   Bilateral Distance:    Right Eye Near:   Left Eye Near:    Bilateral Near:     Physical Exam Vitals signs and nursing note reviewed.  Constitutional:      General: She is not in acute distress.    Appearance: She is not diaphoretic.  Abdominal:     General: There is no distension.     Palpations: Abdomen is soft. There is no mass.     Tenderness: There is no abdominal tenderness. There is no right CVA tenderness, left CVA tenderness, guarding or rebound.     Hernia: No hernia is present.  Neurological:     Mental Status: She is alert.      UC Treatments / Results  Labs (all labs ordered are listed, but only abnormal results are displayed) Labs Reviewed  URINALYSIS, COMPLETE (UACMP) WITH MICROSCOPIC - Abnormal; Notable for the following components:      Result Value   APPearance CLOUDY (*)    Hgb urine dipstick LARGE (*)    Protein, ur 30 (*)    Leukocytes,Ua SMALL (*)    Bacteria, UA MANY (*)    All other components within normal limits  URINE CULTURE    EKG   Radiology No results found.   Procedures Procedures (including critical care time)  Medications Ordered in UC Medications - No data to display  Initial Impression / Assessment and Plan / UC Course  I have reviewed the triage vital signs and the nursing notes.  Pertinent labs & imaging results that were available during my care of the patient were reviewed by me and considered in my medical decision making (see chart for details).      Final Clinical Impressions(s) / UC Diagnoses   Final diagnoses:  Urinary tract infection with hematuria, site unspecified     Discharge Instructions     Increase water intake    ED Prescriptions    Medication Sig Dispense Auth. Provider   cephALEXin (KEFLEX) 500 MG capsule Take 1 capsule (500 mg total) by mouth 2 (two) times daily. 14 capsule Payton Mccallumonty, Karina Nofsinger, MD     1. Lab results and diagnosis reviewed with patient 2. rx as per orders above; reviewed possible side effects, interactions, risks and benefits  3. Recommend supportive treatment as above  4. Follow-up prn if symptoms worsen or don't improve   Controlled Substance Prescriptions [place] Controlled Substance Registry consulted? Not Applicable   Payton Mccallumonty, Ott Zimmerle, MD 04/07/19 2023

## 2019-04-07 NOTE — Discharge Instructions (Signed)
Increase water intake

## 2019-04-07 NOTE — ED Triage Notes (Signed)
Patient c/o burning when urinating that started on Monday.  Patient denies vaginal discharge.

## 2019-04-10 ENCOUNTER — Telehealth (HOSPITAL_COMMUNITY): Payer: Self-pay | Admitting: Emergency Medicine

## 2019-04-10 LAB — URINE CULTURE
Culture: >=100000 — AB
Special Requests: NORMAL

## 2019-04-10 NOTE — Telephone Encounter (Signed)
Treated with keflex. Contacted patient to see how she was feeling, pt states she is feeling much better.

## 2019-04-26 ENCOUNTER — Encounter: Payer: Self-pay | Admitting: Family Medicine

## 2019-04-26 ENCOUNTER — Other Ambulatory Visit: Payer: Self-pay

## 2019-04-26 ENCOUNTER — Ambulatory Visit: Payer: BC Managed Care – PPO | Admitting: Family Medicine

## 2019-04-26 VITALS — BP 110/66 | HR 95 | Temp 97.8°F | Resp 12 | Ht 62.0 in | Wt 172.2 lb

## 2019-04-26 DIAGNOSIS — E7800 Pure hypercholesterolemia, unspecified: Secondary | ICD-10-CM | POA: Diagnosis not present

## 2019-04-26 DIAGNOSIS — E669 Obesity, unspecified: Secondary | ICD-10-CM | POA: Diagnosis not present

## 2019-04-26 DIAGNOSIS — E559 Vitamin D deficiency, unspecified: Secondary | ICD-10-CM | POA: Diagnosis not present

## 2019-04-26 DIAGNOSIS — L858 Other specified epidermal thickening: Secondary | ICD-10-CM

## 2019-04-26 DIAGNOSIS — Z23 Encounter for immunization: Secondary | ICD-10-CM

## 2019-04-26 MED ORDER — TRIAMCINOLONE ACETONIDE 0.1 % EX CREA: 1.0000 "application " | TOPICAL_CREAM | Freq: Two times a day (BID) | CUTANEOUS | 0 refills | Status: DC

## 2019-04-26 NOTE — Progress Notes (Signed)
Name: Sierra Clay   MRN: [id number]    DOB: 1991/06/22   Date:04/26/2019       Progress Note  Subjective  Chief Complaint  Chief Complaint  Patient presents with  . Asthma  . Hyperlipidemia  . Obesity    HPI  Vitamin D deficiency: seen by Dr. Valentino Saxon, reviewed labs and vitamin D was very low , she has been taking rx vitamin D prescription   Hyperlipidemia: LDL was 156 in March 2020, discussed life style modification, HDL was good, normal triglycerides not other risk factors we will not treat her at this time with medication. No significant family history of sudden death, heart attacks or strokes. She states her diet has not changed much since last visit  Keratosis pilaris: bumpy rash on both outer arms, not itchy, not using any medications at this time  Obesity: she states her weight never went down after the birth of her son May 2019, her weight prior to pregnancy was 154 lbs but on delivery day was 190 lbs. She has not been able to get back to her pre-maternity weight. She has tried keto diet and lost weight but stopped after 4 months because she was feeling tired all the time. Explained likely because she was not drinking enough water or eating enough protein. She is skipping breakfast and eating lunch and dinner at [employer]. Discussed portion control, resume walking daily. Explained initial goal is to go down to 163 lbs   Patient Active Problem List   Diagnosis Date Noted  . History of asthma 10/24/2018  . Pure hypercholesterolemia 10/24/2018  . Obesity (BMI 30.0-34.9) 10/24/2018  . Vitamin D deficiency 10/24/2018    Past Surgical History:  Procedure Laterality Date  . NO PAST SURGERIES      Family History  Problem Relation Age of Onset  . Diabetes Paternal Grandmother   . Cancer Maternal Grandmother   . Hypercholesterolemia Mother   . Hypercholesterolemia Father     Social History   Socioeconomic History  . Marital status: Media planner   Spouse name: Renee Pain  . Number of children: 2  . Years of education: Not on file  . Highest education level: Bachelor's degree (e.g., BA, AB, BS)  Occupational History  . 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 Financial resource strain: Not hard at all  . Food insecurity    Worry: Never true    Inability: Never true  . Transportation needs    Medical: No    Non-medical: No  Tobacco Use  . Smoking status: Never Smoker  . Smokeless tobacco: Never Used  Substance and Sexual Activity  . Alcohol use: No  . Drug use: No  . Sexual activity: Yes    Partners: Male    Birth control/protection: None, I.U.D.  Lifestyle  . Physical activity    Days per week: 3 days    Minutes per session: 30 min  . Stress: Not at all  Relationships  . Social connections    Talks on phone: More than three times a week    Gets together: Three times a week    Attends religious service: 1 to 4 times per year    Active member of club or organization: No    Attends meetings of clubs or organizations: Never    Relationship status: Living with partner  . Intimate partner violence    Fear of current or ex partner: No  Emotionally abused: No    Physically abused: No    Forced sexual activity: No  Other Topics Concern  . 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  triamcinolone cream (KENALOG) 0.1 %, Apply 1 application topically 2 (two) times daily. Mixed with lotion, Disp: 80 g, Rfl: 0  Current Facility-Administered Medications:  .  levonorgestrel (MIRENA) 20 MCG/24HR IUD, , Intrauterine, Once, Schuman, Christanna R,  MD  No Known Allergies  I personally reviewed active problem list, medication list, allergies, family history, social history with the patient/caregiver today.   ROS  Constitutional: Negative for fever or weight change.  Respiratory: Negative for cough and shortness of breath.   Cardiovascular: Negative for chest pain or palpitations.  Gastrointestinal: Negative for abdominal pain, no bowel changes.  Musculoskeletal: Negative for gait problem or joint swelling.  Skin: Negative for rash.  Neurological: Negative for dizziness or headache.  No other specific complaints in a complete review of systems (except as listed in HPI above).   Objective  Vitals:   04/26/19 0749  BP: 110/66  Pulse: 95  Resp: 12  Temp: 97.8 F (36.6 C)  TempSrc: Temporal  SpO2: 99%  Weight: 172 lb 3.2 oz (78.1 kg)  Height: 5\' 2"  (1.575 m)    Body mass index is 31.5 kg/m.  Physical Exam  Constitutional: Patient appears well-developed and well-nourished. Obese  No distress.  HEENT: head atraumatic, normocephalic, pupils equal and reactive to light Cardiovascular: Normal rate, regular rhythm and normal heart sounds.  No murmur heard. No BLE edema. Pulmonary/Chest: Effort normal and breath sounds normal. No respiratory distress. Abdominal: Soft.  There is no tenderness. Psychiatric: Patient has a normal mood and affect. behavior is normal. Judgment and thought content normal.  Recent Results (from the past 2160 hour(s))  Urinalysis, Complete w Microscopic     Status: Abnormal   Collection Time: 04/07/19  4:47 PM  Result Value Ref Range   Color, Urine YELLOW YELLOW   APPearance CLOUDY (A) CLEAR   Specific Gravity, Urine 1.020 1.005 - 1.030   pH 7.0 5.0 - 8.0   Glucose, UA NEGATIVE NEGATIVE mg/dL   Hgb urine dipstick LARGE (A) NEGATIVE   Bilirubin Urine NEGATIVE NEGATIVE   Ketones, ur NEGATIVE NEGATIVE mg/dL   Protein, ur 30 (A) NEGATIVE mg/dL   Nitrite NEGATIVE NEGATIVE   Leukocytes,Ua SMALL  (A) NEGATIVE   Squamous Epithelial / LPF 0-5 0 - 5   WBC, UA 11-20 0 - 5 WBC/hpf   RBC / HPF 21-50 0 - 5 RBC/hpf   Bacteria, UA MANY (A) NONE SEEN    Comment: Performed at [facility], [address], [place], [place] [address]  Urine culture     Status: Abnormal   Collection Time: 04/07/19  4:47 PM   Specimen: Urine, Clean Catch  Result Value Ref Range   Specimen Description      URINE, CLEAN CATCH Performed at [facility], [address], [place], [place] [address]    Special Requests      Normal Performed at [facility],  [address] [place], [place] [address]    Culture >=100,000 COLONIES/mL STAPHYLOCOCCUS SAPROPHYTICUS (A)    Report Status 04/10/2019 FINAL    Organism ID, Bacteria STAPHYLOCOCCUS SAPROPHYTICUS (A)       Susceptibility   Staphylococcus saprophyticus - MIC*    CIPROFLOXACIN <=0.5 SENSITIVE Sensitive     GENTAMICIN <=0.5 SENSITIVE Sensitive     NITROFURANTOIN <=16 SENSITIVE Sensitive     OXACILLIN >=4 RESISTANT Resistant     TETRACYCLINE <=1 SENSITIVE Sensitive     VANCOMYCIN <=0.5 SENSITIVE Sensitive     TRIMETH/SULFA <=10 SENSITIVE Sensitive     CLINDAMYCIN <=0.25 SENSITIVE Sensitive     RIFAMPIN <=0.5 SENSITIVE Sensitive     Inducible Clindamycin NEGATIVE Sensitive     * >=100,000 COLONIES/mL STAPHYLOCOCCUS SAPROPHYTICUS      PHQ2/9: Depression screen [facility] 2/9 04/26/2019 10/24/2018  Decreased Interest 0 0  Down, Depressed, Hopeless 0 0  PHQ - 2 Score 0 0  Altered sleeping 0 0  Tired, decreased energy 0 0  Change in appetite 0 0  Feeling bad or failure about yourself  0 0  Trouble concentrating 0 0  Moving slowly or fidgety/restless 0 0  Suicidal thoughts 0 0  PHQ-9 Score 0 0  Difficult doing work/chores - Not difficult at all    phq 9 is negative   Fall Risk: Fall Risk  04/26/2019 10/24/2018  Falls in the past year? 0 0  Number falls in past yr: 0 0  Injury with Fall? 0 0     Functional Status  Survey: Is the patient deaf or have difficulty hearing?: No Does the patient have difficulty seeing, even when wearing glasses/contacts?: No Does the patient have difficulty concentrating, remembering, or making decisions?: No Does the patient have difficulty walking or climbing stairs?: No Does the patient have difficulty dressing or bathing?: No Does the patient have difficulty doing errands alone such as visiting a doctor's office or shopping?: No    Assessment & Plan   1. Pure hypercholesterolemia  Discussed healthy diet again   2. Need for immunization against influenza  - Flu Vaccine QUAD 36+ mos IM  3. Obesity (BMI 30.0-34.9)  Discussed with the patient the risk posed by an increased BMI. Discussed importance of portion control, calorie counting and at least 150 minutes of physical activity weekly. Avoid sweet beverages and drink more water. Eat at least 6 servings of fruit and vegetables daily   4. Vitamin D deficiency  Taking vitamin D rx

## 2019-05-12 ENCOUNTER — Other Ambulatory Visit: Payer: Self-pay

## 2019-05-12 DIAGNOSIS — Z20822 Contact with and (suspected) exposure to covid-19: Secondary | ICD-10-CM

## 2019-05-14 LAB — NOVEL CORONAVIRUS, NAA: SARS-CoV-2, NAA: NOT DETECTED

## 2019-09-20 ENCOUNTER — Encounter (HOSPITAL_COMMUNITY): Payer: Self-pay | Admitting: Emergency Medicine

## 2019-09-20 ENCOUNTER — Other Ambulatory Visit: Payer: Self-pay

## 2019-09-20 ENCOUNTER — Emergency Department (HOSPITAL_COMMUNITY)
Admission: EM | Admit: 2019-09-20 | Discharge: 2019-09-20 | Disposition: A | Discharge status: Home / Self Care | Payer: No Typology Code available for payment source | Attending: Emergency Medicine | Admitting: Emergency Medicine

## 2019-09-20 DIAGNOSIS — Y929 Unspecified place or not applicable: Secondary | ICD-10-CM | POA: Diagnosis not present

## 2019-09-20 DIAGNOSIS — Y9389 Activity, other specified: Secondary | ICD-10-CM | POA: Diagnosis not present

## 2019-09-20 DIAGNOSIS — S0990XA Unspecified injury of head, initial encounter: Secondary | ICD-10-CM | POA: Diagnosis present

## 2019-09-20 DIAGNOSIS — Y990 Civilian activity done for income or pay: Secondary | ICD-10-CM | POA: Insufficient documentation

## 2019-09-20 DIAGNOSIS — S060X0A Concussion without loss of consciousness, initial encounter: Secondary | ICD-10-CM | POA: Insufficient documentation

## 2019-09-20 NOTE — ED Provider Notes (Signed)
[facility] Emergency Department Provider Note MRN:  [id number]  Arrival date & time: 09/20/19     Chief Complaint   Fall   History of Present Illness   [name] is a 29 y.o. year-old female with a history of asthma presenting to the ED with chief complaint of fall.  This morning at 8 AM patient was getting out of her car when she slipped on the ice and fell backwards striking her head against the pavement.  No loss of consciousness.  Endorsing continued tenderness to the back of the head and dull frontal headache.  Denies neck pain, no nausea or vomiting, no other complaints or injuries.  No numbness or weakness to the arms or legs.  Symptoms are mild to moderate, constant.  Review of Systems  A complete 10 system review of systems was obtained and all systems are negative except as noted in the HPI and PMH.   Patient's Health History    Past Medical History:  Diagnosis Date  . Asthma    Mild  . History of abnormal cervical Pap smear 2012, 2013, 2016  . Hypercholesteremia     Past Surgical History:  Procedure Laterality Date  . NO PAST SURGERIES      Family History  Problem Relation Age of Onset  . Diabetes Paternal Grandmother   . Cancer Maternal Grandmother   . Hypercholesterolemia Mother   . Hypercholesterolemia Father     Social History   Socioeconomic History  . Marital status: Soil scientist    Spouse name: Corinna Capra  . Number of children: 2  . Years of education: Not on file  . Highest education level: Bachelor's degree (e.g., BA, AB, BS)  Occupational History  . Occupation: Pharmacist, hospital     Comment: AP and environmental science   Tobacco Use  . Smoking status: Never Smoker  . Smokeless tobacco: Never Used  Substance and Sexual Activity  . Alcohol use: No  . Drug use: No  . Sexual activity: Yes    Partners: Male    Birth control/protection: None, I.U.D.  Other Topics Concern  . 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 Difficulty of Paying Living Expenses: Not hard at all  Food Insecurity: No Food Insecurity  . Worried About Charity fundraiser in the Last Year: Never true  . Ran Out of Food in the Last Year: Never true  Transportation Needs: No Transportation Needs  . Lack of Transportation (Medical): No  . Lack of Transportation (Non-Medical): No  Physical Activity: Insufficiently Active  . Days of Exercise per Week: 3 days  . Minutes of Exercise per Session: 30 min  Stress: No Stress Concern Present  . Feeling of Stress : Not at all  Social Connections: Slightly Isolated  . Frequency of Communication with Friends and Family: More than three times a week  . Frequency of Social Gatherings with Friends and Family: Three times a week  . Attends Religious Services: 1 to 4 times per year  . Active Member of Clubs or Organizations: No  . Attends Archivist Meetings: Never  . Marital Status: Living with partner  Intimate Partner Violence: Not At Risk  . Fear  of Current or Ex-Partner: No  . Emotionally Abused: No  . Physically Abused: No  . Sexually Abused: No     Physical Exam   Vitals:   09/20/19 1013  BP: 125/87  Pulse: 79  Resp: 18  Temp: 97.9 F (36.6 C)  SpO2: 100%    CONSTITUTIONAL: Well-appearing, NAD NEURO:  Alert and oriented x 3, no focal deficits EYES:  eyes equal and reactive ENT/NECK:  no LAD, no JVD CARDIO: Regular rate, well-perfused, normal S1 and S2 PULM:  CTAB no wheezing or rhonchi GI/GU:  normal bowel sounds, non-distended, non-tender MSK/SPINE:  No gross deformities, no edema SKIN:  no rash, atraumatic PSYCH:  Appropriate speech and behavior  *Additional and/or pertinent findings included in MDM below  Diagnostic and Interventional  Summary    EKG Interpretation  Date/Time:    Ventricular Rate:    PR Interval:    QRS Duration:   QT Interval:    QTC Calculation:   R Axis:     Text Interpretation:        Cardiac Monitoring Interpretation:  Labs Reviewed - No data to display  No orders to display    Medications - No data to display   Procedures  /  Critical Care Procedures  ED Course and Medical Decision Making  I have reviewed the triage vital signs, the nursing notes, and pertinent available records from the EMR.  Pertinent labs & imaging results that were available during my care of the patient were reviewed by me and considered in my medical decision making (see below for details).     No loss of consciousness, no anticoagulation, no evidence of basilar skull fracture on exam, reassuring neurological exam, no indication for CNS imaging today.  No midline cervical tenderness.  Consistent with mild concussion, appropriate for discharge.    Elmer Sow. Pilar Plate, MD [facility] Emergency Medicine [facility] [email or web address]  Final Clinical Impressions(s) / ED Diagnoses     ICD-10-CM   1. Concussion without loss of consciousness, initial encounter  S06.0X0A     ED Discharge Orders    None       Discharge Instructions Discussed with and Provided to Patient:     Discharge Instructions     You were evaluated in the Emergency Department and after careful evaluation, we did not find any emergent condition requiring admission or further testing in the hospital.  Your exam/testing today was overall reassuring.  Your symptoms seem to be due to a mild concussion.  As discussed, please practice mental and physical rest for the next 1-2 days until you feel back to normal.  Use tylenol for pain.  Please return to the Emergency Department if you experience any worsening of your condition.  We encourage you to follow up with a primary care provider.  Thank you for allowing [place] to be a part  of your care.       Sabas Sous, MD 09/20/19 1038

## 2019-09-20 NOTE — Discharge Instructions (Addendum)
You were evaluated in the Emergency Department and after careful evaluation, we did not find any emergent condition requiring admission or further testing in the hospital.  Your exam/testing today was overall reassuring.  Your symptoms seem to be due to a mild concussion.  As discussed, please practice mental and physical rest for the next 1-2 days until you feel back to normal.  Use tylenol for pain.  Please return to the Emergency Department if you experience any worsening of your condition.  We encourage you to follow up with a primary care provider.  Thank you for allowing [place] to be a part of your care.

## 2019-09-20 NOTE — ED Triage Notes (Signed)
Patient complains of head ache and pain after slipping on ice and hitting her head. Patient denies LOC, double vision, dizziness.

## 2019-09-21 ENCOUNTER — Other Ambulatory Visit: Payer: Self-pay

## 2019-09-21 ENCOUNTER — Emergency Department (HOSPITAL_COMMUNITY): Payer: BC Managed Care – PPO

## 2019-09-21 ENCOUNTER — Emergency Department (HOSPITAL_COMMUNITY): Payer: No Typology Code available for payment source

## 2019-09-21 ENCOUNTER — Ambulatory Visit: Payer: Self-pay

## 2019-09-21 ENCOUNTER — Encounter (HOSPITAL_COMMUNITY): Payer: Self-pay

## 2019-09-21 ENCOUNTER — Emergency Department (HOSPITAL_COMMUNITY)
Admission: EM | Admit: 2019-09-21 | Discharge: 2019-09-21 | Disposition: A | Discharge status: Home / Self Care | Payer: No Typology Code available for payment source | Attending: Emergency Medicine | Admitting: Emergency Medicine

## 2019-09-21 DIAGNOSIS — W000XXD Fall on same level due to ice and snow, subsequent encounter: Secondary | ICD-10-CM | POA: Diagnosis not present

## 2019-09-21 DIAGNOSIS — J45909 Unspecified asthma, uncomplicated: Secondary | ICD-10-CM | POA: Insufficient documentation

## 2019-09-21 DIAGNOSIS — R519 Headache, unspecified: Secondary | ICD-10-CM | POA: Insufficient documentation

## 2019-09-21 DIAGNOSIS — M542 Cervicalgia: Secondary | ICD-10-CM

## 2019-09-21 DIAGNOSIS — R1030 Lower abdominal pain, unspecified: Secondary | ICD-10-CM

## 2019-09-21 DIAGNOSIS — M62838 Other muscle spasm: Secondary | ICD-10-CM | POA: Diagnosis not present

## 2019-09-21 LAB — CBC WITH DIFFERENTIAL/PLATELET
Abs Immature Granulocytes: 0.02 10*3/uL (ref 0.00–0.07)
Basophils Absolute: 0 10*3/uL (ref 0.0–0.1)
Basophils Relative: 0 %
Eosinophils Absolute: 0.1 10*3/uL (ref 0.0–0.5)
Eosinophils Relative: 1 %
HCT: 45.3 % (ref 36.0–46.0)
Hemoglobin: 14.6 g/dL (ref 12.0–15.0)
Immature Granulocytes: 0 %
Lymphocytes Relative: 38 %
Lymphs Abs: 3 10*3/uL (ref 0.7–4.0)
MCH: 30.5 pg (ref 26.0–34.0)
MCHC: 32.2 g/dL (ref 30.0–36.0)
MCV: 94.8 fL (ref 80.0–100.0)
Monocytes Absolute: 0.4 10*3/uL (ref 0.1–1.0)
Monocytes Relative: 5 %
Neutro Abs: 4.4 10*3/uL (ref 1.7–7.7)
Neutrophils Relative %: 56 %
Platelets: 275 10*3/uL (ref 150–400)
RBC: 4.78 MIL/uL (ref 3.87–5.11)
RDW: 12.2 % (ref 11.5–15.5)
WBC: 7.9 10*3/uL (ref 4.0–10.5)
nRBC: 0 % (ref 0.0–0.2)

## 2019-09-21 LAB — WET PREP, GENITAL
Sperm: NONE SEEN
Trich, Wet Prep: NONE SEEN
Yeast Wet Prep HPF POC: NONE SEEN

## 2019-09-21 LAB — COMPREHENSIVE METABOLIC PANEL
ALT: 19 U/L (ref 0–44)
AST: 21 U/L (ref 15–41)
Albumin: 4.2 g/dL (ref 3.5–5.0)
Alkaline Phosphatase: 82 U/L (ref 38–126)
Anion gap: 10 (ref 5–15)
BUN: 18 mg/dL (ref 6–20)
CO2: 27 mmol/L (ref 22–32)
Calcium: 9.1 mg/dL (ref 8.9–10.3)
Chloride: 102 mmol/L (ref 98–111)
Creatinine, Ser: 0.68 mg/dL (ref 0.44–1.00)
GFR calc Af Amer: >60 mL/min (ref >60)
GFR calc non Af Amer: >60 mL/min (ref >60)
Glucose, Bld: 101 mg/dL — ABNORMAL HIGH (ref 70–99)
Potassium: 3.8 mmol/L (ref 3.5–5.1)
Sodium: 139 mmol/L (ref 135–145)
Total Bilirubin: 0.6 mg/dL (ref 0.3–1.2)
Total Protein: 7.3 g/dL (ref 6.5–8.1)

## 2019-09-21 LAB — URINALYSIS, ROUTINE W REFLEX MICROSCOPIC
Bilirubin Urine: NEGATIVE
Glucose, UA: NEGATIVE mg/dL
Hgb urine dipstick: NEGATIVE
Ketones, ur: NEGATIVE mg/dL
Leukocytes,Ua: NEGATIVE
Nitrite: NEGATIVE
Protein, ur: NEGATIVE mg/dL
Specific Gravity, Urine: 1.025 (ref 1.005–1.030)
pH: 6 (ref 5.0–8.0)

## 2019-09-21 LAB — LIPASE, BLOOD: Lipase: 22 U/L (ref 11–51)

## 2019-09-21 LAB — PREGNANCY, URINE: Preg Test, Ur: NEGATIVE

## 2019-09-21 MED ORDER — LIDOCAINE 5 % EX PTCH
1.0000 | MEDICATED_PATCH | CUTANEOUS | Status: DC
  Administered 2019-09-21: 1 via TRANSDERMAL
  Filled 2019-09-21: qty 1

## 2019-09-21 MED ORDER — METHOCARBAMOL 500 MG PO TABS: 500.0000 mg | ORAL_TABLET | Freq: Two times a day (BID) | ORAL | 0 refills | Status: DC

## 2019-09-21 MED ORDER — NAPROXEN 500 MG PO TABS: 500.0000 mg | ORAL_TABLET | Freq: Two times a day (BID) | ORAL | 0 refills | Status: DC

## 2019-09-21 MED ORDER — KETOROLAC TROMETHAMINE 30 MG/ML IJ SOLN
30.0000 mg | Freq: Once | INTRAMUSCULAR | Status: AC
  Administered 2019-09-21: 14:00:00 30 mg via INTRAMUSCULAR
  Filled 2019-09-21: qty 1

## 2019-09-21 MED ORDER — KETOROLAC TROMETHAMINE 30 MG/ML IJ SOLN: 30.0000 mg | Freq: Once | INTRAMUSCULAR | Status: DC

## 2019-09-21 MED ORDER — METHOCARBAMOL 500 MG PO TABS
500.0000 mg | ORAL_TABLET | Freq: Once | ORAL | Status: AC
  Administered 2019-09-21: 14:00:00 500 mg via ORAL
  Filled 2019-09-21: qty 1

## 2019-09-21 NOTE — ED Triage Notes (Signed)
Pt here yesterday for neck pain after slipping on ice. Denies LOC. Pt states it is hard to swallow, slight headache, and continued neck pain.

## 2019-09-21 NOTE — Discharge Instructions (Signed)
Neck pain is likely due to muscle spasm, your CT scan does not show any fracture or malalignment.  Your head CT and x-rays of your back look good as well.  I am not sure what is causing your abdominal pain but your lab work is very reassuring, I do not see any signs of urinary tract infection.  If you develop worsening pain, fevers, vomiting or blood in your stools or any other new or concerning symptoms please return for further evaluation.

## 2019-09-21 NOTE — ED Provider Notes (Signed)
[facility] EMERGENCY DEPARTMENT Provider Note   CSN: [id number] Arrival date & time: 09/21/19  1016     History Chief Complaint  Patient presents with  . Neck Pain  . Abdominal Pain    Sierra Clay is a 29 y.o. female.  Sierra Clay is a 29 y.o. female with a history of asthma and hyperlipidemia, who presents for evaluation of neck pain after a fall yesterday and also reports an episode of incontinence of stool and some lower abdominal pain.  Patient slipped and fell backwards striking her head yesterday when she was getting out of her car and slipped on some ice.  She was seen in the ED afterwards did not have any midline neck tenderness at that time and had a normal neurologic exam with no loss of consciousness, no head imaging was done patient was thought to have a mild concussion at most.  Reports that when she got home she realized that she thinks she urinated on herself during the fall.  Later in the evening she also had an episode of a lot of stool incontinence she reports it was diarrhea, nonbloody.  She reports since then she has not had any additional episodes of incontinence.  Had some minimal back pain if any.  Does report that she has had some intermittent pain across her lower abdomen that is mild to moderate.  No associated nausea or vomiting.  No dysuria or urinary frequency.  No vaginal bleeding or discharge.  No fevers or chills.  No history of similar abdominal pain.  States that she primarily came in because of the severe neck pain.  She reports it starts on the right side along the spine and wraps around the side of the neck and is worse with any range of motion.  She denies any numbness tingling or weakness in her upper or lower extremities.  Reports she still had some mild headaches but no vision changes.  No meds prior to arrival.  No other aggravating or alleviating factors.        Past Medical History:  Diagnosis Date  . Asthma    Mild  . History of  abnormal cervical Pap smear 2012, 2013, 2016  . Hypercholesteremia     Patient Active Problem List   Diagnosis Date Noted  . History of asthma 10/24/2018  . Pure hypercholesterolemia 10/24/2018  . Obesity (BMI 30.0-34.9) 10/24/2018  . Vitamin D deficiency 10/24/2018    Past Surgical History:  Procedure Laterality Date  . NO PAST SURGERIES       OB History    Gravida  3   Para  2   Term  2   Preterm      AB  1   Living  2     SAB  1   TAB      Ectopic      Multiple  0   Live Births  2           Family History  Problem Relation Age of Onset  . Diabetes Paternal Grandmother   . Cancer Maternal Grandmother   . Hypercholesterolemia Mother   . Hypercholesterolemia Father     Social History   Tobacco Use  . Smoking status: Never Smoker  . Smokeless tobacco: Never Used  Substance Use Topics  . Alcohol use: No  . Drug use: No    Home Medications Prior to Admission medications   Medication Sig Start Date End Date Taking? Authorizing Provider  triamcinolone  cream (KENALOG) 0.1 % Apply 1 application topically 2 (two) times daily. Mixed with lotion Patient taking differently: Apply 1 application topically 2 (two) times daily as needed. Mixed with lotion 04/26/19  Yes Sowles, Drue Stager, MD  Vitamin D, Ergocalciferol, (DRISDOL) 1.25 MG (50000 UT) CAPS capsule Take 1 capsule (50,000 Units total) by mouth every 7 (seven) days. 10/11/18  Yes Lawhorn, Lara Mulch, CNM  methocarbamol (ROBAXIN) 500 MG tablet Take 1 tablet (500 mg total) by mouth 2 (two) times daily. 09/21/19   Jacqlyn Larsen, PA-C  naproxen (NAPROSYN) 500 MG tablet Take 1 tablet (500 mg total) by mouth 2 (two) times daily. 09/21/19   Jacqlyn Larsen, PA-C    Allergies    Patient has no known allergies.  Review of Systems   Review of Systems  Constitutional: Negative for chills and fever.  HENT: Negative.   Eyes: Negative for visual disturbance.  Respiratory: Negative for cough and shortness  of breath.   Cardiovascular: Negative for chest pain.  Gastrointestinal: Positive for abdominal pain and diarrhea. Negative for blood in stool, nausea and vomiting.  Genitourinary: Negative for dysuria, frequency, vaginal bleeding and vaginal discharge.  Musculoskeletal: Positive for neck pain. Negative for arthralgias, back pain and myalgias.  Skin: Negative for color change and rash.  Neurological: Positive for headaches. Negative for weakness and numbness.    Physical Exam Updated Vital Signs BP 126/77 (BP Location: Right Arm)   Pulse 87   Temp 98.7 F (37.1 C) (Oral)   Resp 12   SpO2 99%   Physical Exam Vitals and nursing note reviewed.  Constitutional:      General: She is not in acute distress.    Appearance: She is well-developed. She is not ill-appearing or diaphoretic.  HENT:     Head: Normocephalic and atraumatic.  Eyes:     General:        Right eye: No discharge.        Left eye: No discharge.     Pupils: Pupils are equal, round, and reactive to light.  Neck:     Comments: Significant tenderness over the right paraspinal muscles and palpable spasm of the paraspinal muscles, trapezius and sternocleidomastoid on the right.  Patient with mild midline tenderness.  Decreased range of motion due to pain. Cardiovascular:     Rate and Rhythm: Normal rate and regular rhythm.     Heart sounds: Normal heart sounds. No murmur. No friction rub. No gallop.   Pulmonary:     Effort: Pulmonary effort is normal. No respiratory distress.     Breath sounds: Normal breath sounds. No wheezing or rales.     Comments: Respirations equal and unlabored, patient able to speak in full sentences, lungs clear to auscultation bilaterally Abdominal:     General: Bowel sounds are normal. There is no distension.     Palpations: Abdomen is soft. There is no mass.     Tenderness: There is abdominal tenderness. There is no guarding.     Comments: Abdomen is soft, nondistended, bowel sounds are  present throughout, patient has some mild tenderness across lower abdomen, exam is not consistent and pain seems to be migratory across the lower abdomen.  No guarding or peritoneal signs.  No CVA tenderness.  Genitourinary:    Comments: Chaperone present during pelvic exam. No external genital lesions noted. Speculum exam without bleeding or discharge noted, no cervical lesions. On bimanual exam there is no cervical motion tenderness, no focal adnexal tenderness or palpable masses. Normal  rectal tone Musculoskeletal:        General: No deformity.     Cervical back: Neck supple.  Skin:    General: Skin is warm and dry.     Capillary Refill: Capillary refill takes less than 2 seconds.  Neurological:     Mental Status: She is alert.     Coordination: Coordination normal.     Comments: Speech is clear, able to follow commands CN III-XII intact Normal strength in upper and lower extremities bilaterally including dorsiflexion and plantar flexion, strong and equal grip strength Sensation normal to light and sharp touch Moves extremities without ataxia, coordination intact  Psychiatric:        Mood and Affect: Mood normal.        Behavior: Behavior normal.     ED Results / Procedures / Treatments   Labs (all labs ordered are listed, but only abnormal results are displayed) Labs Reviewed  WET PREP, GENITAL - Abnormal; Notable for the following components:      Result Value   Clue Cells Wet Prep HPF POC PRESENT (*)    WBC, Wet Prep HPF POC FEW (*)    All other components within normal limits  COMPREHENSIVE METABOLIC PANEL - Abnormal; Notable for the following components:   Glucose, Bld 101 (*)    All other components within normal limits  PREGNANCY, URINE  LIPASE, BLOOD  CBC WITH DIFFERENTIAL/PLATELET  URINALYSIS, ROUTINE W REFLEX MICROSCOPIC  GC/CHLAMYDIA PROBE AMP ([place]) NOT AT [facility]    EKG None  Radiology DG Lumbar Spine Complete  Result Date: 09/21/2019 CLINICAL  DATA:  Pain following fall EXAM: LUMBAR SPINE - COMPLETE 4+ VIEW COMPARISON:  None. FINDINGS: Frontal, lateral, spot lumbosacral lateral, and bilateral oblique views were obtained. There are 5 non-rib-bearing lumbar type vertebral bodies. There is slight upper lumbar dextroscoliosis. There is no fracture or spondylolisthesis. The disc spaces appear normal. There is no appreciable facet arthropathy. There is an intrauterine device in the mid-pelvis. IMPRESSION: Slight scoliosis. No fracture or spondylolisthesis. No appreciable arthropathy. Intrauterine device in mid pelvis. Electronically Signed   By: Bretta Bang III M.D.   On: 09/21/2019 13:10   CT Head Wo Contrast  Result Date: 09/21/2019 CLINICAL DATA:  Pain after fall yesterday. EXAM: CT HEAD WITHOUT CONTRAST CT CERVICAL SPINE WITHOUT CONTRAST TECHNIQUE: Multidetector CT imaging of the head and cervical spine was performed following the standard protocol without intravenous contrast. Multiplanar CT image reconstructions of the cervical spine were also generated. COMPARISON:  Dec 25, 2007 FINDINGS: CT HEAD FINDINGS Brain: No evidence of acute infarction, hemorrhage, hydrocephalus, extra-axial collection or mass lesion/mass effect. Vascular: No hyperdense vessel or unexpected calcification. Skull: Normal. Negative for fracture or focal lesion. Sinuses/Orbits: No acute finding. Other: No other abnormalities are identified. CT CERVICAL SPINE FINDINGS Alignment: There is reversal of normal lordosis. No other malalignment. Skull base and vertebrae: No acute fracture. No primary bone lesion or focal pathologic process. Soft tissues and spinal canal: No prevertebral fluid or swelling. No visible canal hematoma. Disc levels:  No significant degenerative changes. Upper chest: Negative. Other: No other abnormalities. IMPRESSION: 1. No acute intracranial abnormalities. 2. No fracture or traumatic malalignment in the cervical spine. Electronically Signed   By: Gerome Sam III M.D   On: 09/21/2019 13:16   CT Cervical Spine Wo Contrast  Result Date: 09/21/2019 CLINICAL DATA:  Pain after fall yesterday. EXAM: CT HEAD WITHOUT CONTRAST CT CERVICAL SPINE WITHOUT CONTRAST TECHNIQUE: Multidetector CT imaging of the head and  cervical spine was performed following the standard protocol without intravenous contrast. Multiplanar CT image reconstructions of the cervical spine were also generated. COMPARISON:  Dec 25, 2007 FINDINGS: CT HEAD FINDINGS Brain: No evidence of acute infarction, hemorrhage, hydrocephalus, extra-axial collection or mass lesion/mass effect. Vascular: No hyperdense vessel or unexpected calcification. Skull: Normal. Negative for fracture or focal lesion. Sinuses/Orbits: No acute finding. Other: No other abnormalities are identified. CT CERVICAL SPINE FINDINGS Alignment: There is reversal of normal lordosis. No other malalignment. Skull base and vertebrae: No acute fracture. No primary bone lesion or focal pathologic process. Soft tissues and spinal canal: No prevertebral fluid or swelling. No visible canal hematoma. Disc levels:  No significant degenerative changes. Upper chest: Negative. Other: No other abnormalities. IMPRESSION: 1. No acute intracranial abnormalities. 2. No fracture or traumatic malalignment in the cervical spine. Electronically Signed   By: Gerome Sam III M.D   On: 09/21/2019 13:16    Procedures Procedures (including critical care time)  Medications Ordered in ED Medications  lidocaine (LIDODERM) 5 % 1 patch (1 patch Transdermal Patch Applied 09/21/19 1408)  methocarbamol (ROBAXIN) tablet 500 mg (500 mg Oral Given 09/21/19 1407)  ketorolac (TORADOL) 30 MG/ML injection 30 mg (30 mg Intramuscular Given 09/21/19 1407)    ED Course  I have reviewed the triage vital signs and the nursing notes.  Pertinent labs & imaging results that were available during my care of the patient were reviewed by me and considered in my medical  decision making (see chart for details).    MDM Rules/Calculators/A&P                      29 year old female presents with neck pain after she slipped and fell on ice, also reports some intermittent lower abdominal pain and that last night she had an episode of incontinence of stool with nonbloody diarrhea.  She was seen in the ED after her fall initially yesterday but had reassuring exam and no imaging done.  On exam today she has palpable spasm on the right side of her neck with mild midline tenderness.  Otherwise neurologic exam is unremarkable.  She does report an episode of stool incontinence yesterday, she has had some minimal low back pain, does not have significant midline tenderness, unclear etiology for incontinence.  She does report some intermittent lower abdominal pain starting last night, I have low suspicion for intra-abdominal injury from fall as she fell backwards.  She has some mild tenderness across the low abdomen, but exam is inconsistent on initial exam it was worse on the right but then moved to the left.  Pelvic exam without any adnexal masses or cervical motion tenderness, no significant discharge noted.  Will get CT of the head, C-spine as well as plain films of the lumbar spine and abdominal labs.  Lab work very reassuring, no leukocytosis, normal hemoglobin, no acute electrolyte derangements, normal renal and liver function and normal lipase.  Urinalysis without any signs of infection, negative pregnancy.  GC chlamydia pending, few WBCs present with clue cells, but patient is not symptomatic with this minimal discharge noted, do not feel treatment is indicated.  CTs of the head and cervical spine are unremarkable and show no acute intracranial injury, traumatic fracture or malalignment.  X-rays of the lumbar spine are unremarkable as well.  Patient treated with Toradol and Robaxin with improvement.  On repeat abdominal exam pain is minimal and inconsistent once again.  I have  low suspicion for acute intra-abdominal  pathology that would require surgical intervention and feel that there would be little benefit to imaging at this time, had shared decision-making discussion with the patient regarding this, who is in agreement.  I have given her strict return precautions.  She expresses understanding, discharged home in good condition.  Final Clinical Impression(s) / ED Diagnoses Final diagnoses:  Neck pain  Muscle spasm  Lower abdominal pain    Rx / DC Orders ED Discharge Orders         Ordered    methocarbamol (ROBAXIN) 500 MG tablet  2 times daily     09/21/19 1403    naproxen (NAPROSYN) 500 MG tablet  2 times daily     09/21/19 1403           Dartha Lodge, [place] 09/21/19 1447    Linwood Dibbles, MD 09/22/19 [phone]

## 2019-09-21 NOTE — Telephone Encounter (Signed)
Pt. Reports she fell getting out of her car yesterday and was seen in the ED. States she was told she had a concussion. Today she has neck pain, pain when swallowing and "I've lost control of my bowels." Instructed to have her family take her to the ED now for evaluation.Verbalizes understanding.  Reason for Disposition . Sounds like a serious injury to the triager  Answer Assessment - Initial Assessment Questions 1. MECHANISM: "How did the injury happen?" For falls, ask: "What height did you fall from?" and "What surface did you fall against?"      Larey Seat getting out of her car 2. ONSET: "When did the injury happen?" (Minutes or hours ago)      Yesterday 3. NEUROLOGIC SYMPTOMS: "Was there any loss of consciousness?" "Are there any other neurological symptoms?"      No 4. MENTAL STATUS: "Does the person know who he is, who you are, and where he is?"      Yes 5. LOCATION: "What part of the head was hit?"      Back 6. SCALP APPEARANCE: "What does the scalp look like? Is it bleeding now?" If so, ask: "Is it difficult to stop?"      No 7. SIZE: For cuts, bruises, or swelling, ask: "How large is it?" (e.g., inches or centimeters)      No 8. PAIN: "Is there any pain?" If so, ask: "How bad is it?"  (e.g., Scale 1-10; or mild, moderate, severe)     Mild 9. TETANUS: For any breaks in the skin, ask: "When was the last tetanus booster?"     Unsure 10. OTHER SYMPTOMS: "Do you have any other symptoms?" (e.g., neck pain, vomiting)       Ears are ringing, lost control of her bowels 11. PREGNANCY: "Is there any chance you are pregnant?" "When was your last menstrual period?"       No  Protocols used: HEAD INJURY-A-AH

## 2019-09-23 LAB — GC/CHLAMYDIA PROBE AMP (~~LOC~~) NOT AT ARMC
Chlamydia: NEGATIVE
Neisseria Gonorrhea: NEGATIVE

## 2019-10-09 ENCOUNTER — Encounter: Payer: BC Managed Care – PPO | Admitting: Certified Nurse Midwife

## 2019-10-25 ENCOUNTER — Encounter: Payer: Self-pay | Admitting: Family Medicine

## 2019-10-25 ENCOUNTER — Ambulatory Visit (INDEPENDENT_AMBULATORY_CARE_PROVIDER_SITE_OTHER): Payer: BC Managed Care – PPO | Admitting: Family Medicine

## 2019-10-25 ENCOUNTER — Other Ambulatory Visit: Payer: Self-pay

## 2019-10-25 VITALS — BP 110/80 | HR 99 | Temp 96.8°F | Resp 16 | Ht 62.0 in | Wt 177.9 lb

## 2019-10-25 DIAGNOSIS — L858 Other specified epidermal thickening: Secondary | ICD-10-CM | POA: Diagnosis not present

## 2019-10-25 DIAGNOSIS — E049 Nontoxic goiter, unspecified: Secondary | ICD-10-CM

## 2019-10-25 DIAGNOSIS — E559 Vitamin D deficiency, unspecified: Secondary | ICD-10-CM

## 2019-10-25 DIAGNOSIS — E7800 Pure hypercholesterolemia, unspecified: Secondary | ICD-10-CM | POA: Diagnosis not present

## 2019-10-25 DIAGNOSIS — R7989 Other specified abnormal findings of blood chemistry: Secondary | ICD-10-CM

## 2019-10-25 DIAGNOSIS — Z131 Encounter for screening for diabetes mellitus: Secondary | ICD-10-CM

## 2019-10-25 DIAGNOSIS — Z8709 Personal history of other diseases of the respiratory system: Secondary | ICD-10-CM

## 2019-10-25 DIAGNOSIS — Z87820 Personal history of traumatic brain injury: Secondary | ICD-10-CM

## 2019-10-25 DIAGNOSIS — Z9181 History of falling: Secondary | ICD-10-CM

## 2019-10-25 DIAGNOSIS — Z113 Encounter for screening for infections with a predominantly sexual mode of transmission: Secondary | ICD-10-CM

## 2019-10-25 MED ORDER — VITAMIN D (ERGOCALCIFEROL) 1.25 MG (50000 UNIT) PO CAPS: 50000.0000 [IU] | ORAL_CAPSULE | ORAL | 4 refills | Status: DC

## 2019-10-25 NOTE — Addendum Note (Signed)
Addended by: Ruel Favors on: 10/25/2019 08:49 AM   Modules accepted: Orders

## 2019-10-25 NOTE — Progress Notes (Addendum)
Name: Sierra Clay   MRN: [id number]    DOB: 1990/10/08   Date:10/25/2019       Progress Note  Subjective  Chief Complaint  Chief Complaint  Patient presents with  . Asthma  . Hyperlipidemia  . Obesity    HPI  Vitamin D deficiency: she needs a refill of rx vitamin D , she takes it weekly and we will recheck levels today   Hyperlipidemia: LDL was 156 in March 2020, discussed life style modification, HDL was good, normal triglycerides not other risk factors we will not treat her at this time with medication. No significant family history of sudden death, heart attacks or strokes. We will re check level today   Keratosis pilaris: bumpy rash on both outer arms, not itchy, not using any medications at this time, discussed using lotion mixed with triamcinolone   Obesity: she states her weight never went down after the birth of her son May 2019, her weight prior to pregnancy was 154 lbs but on delivery day was 190 lbs. She has not been able to get back to her pre-maternity weight. She has tried keto diet and lost weight but stopped after 4 months because she was feeling tired all the time. Explained likely because she was not drinking enough water or eating enough protein. She gained more weight since last visit, still nursing her son but planning on weaning him off. She skips breakfast, eats lunch salad with tuna, and chicken and green beans at night.   History of fall 09/2019 : with concussion and also neck pain , went to [facility] because she was emotional. She also had some neck pain afterwards. She states doing well now, no pain, no headaches.   Patient Active Problem List   Diagnosis Date Noted  . History of asthma 10/24/2018  . Pure hypercholesterolemia 10/24/2018  . Obesity (BMI 30.0-34.9) 10/24/2018  . Vitamin D deficiency 10/24/2018    Past Surgical History:  Procedure Laterality Date  . NO PAST SURGERIES      Family History  Problem Relation Age of Onset  . Diabetes  Paternal Grandmother   . Cancer Maternal Grandmother   . Hypercholesterolemia Mother   . Hypercholesterolemia Father     Social History   Tobacco Use  . Smoking status: Never Smoker  . Smokeless tobacco: Never Used  Substance Use Topics  . Alcohol use: No     Current Outpatient Medications:  .  triamcinolone cream (KENALOG) 0.1 %, Apply 1 application topically 2 (two) times daily. Mixed with lotion (Patient taking differently: Apply 1 application topically 2 (two) times daily as needed. Mixed with lotion), Disp: 80 g, Rfl: 0 .  Vitamin D, Ergocalciferol, (DRISDOL) 1.25 MG (50000 UT) CAPS capsule, Take 1 capsule (50,000 Units total) by mouth every 7 (seven) days., Disp: 12 capsule, Rfl: 4 .  methocarbamol (ROBAXIN) 500 MG tablet, Take 1 tablet (500 mg total) by mouth 2 (two) times daily. (Patient not taking: Reported on 10/25/2019), Disp: 20 tablet, Rfl: 0 .  naproxen (NAPROSYN) 500 MG tablet, Take 1 tablet (500 mg total) by mouth 2 (two) times daily. (Patient not taking: Reported on 10/25/2019), Disp: 30 tablet, Rfl: 0  Current Facility-Administered Medications:  .  levonorgestrel (MIRENA) 20 MCG/24HR IUD, , Intrauterine, Once, Schuman, Christanna R, MD  No Known Allergies  I personally reviewed active problem list, medication list, allergies, family history, social history, health maintenance with the patient/caregiver today.   ROS  Constitutional: Negative for fever , positive for mild  weight change.  Respiratory: Negative for cough and shortness of breath.   Cardiovascular: Negative for chest pain or palpitations.  Gastrointestinal: Negative for abdominal pain, no bowel changes.  Musculoskeletal: Negative for gait problem or joint swelling.  Skin: Negative for rash.  Neurological: Negative for dizziness or headache.  No other specific complaints in a complete review of systems (except as listed in HPI above).  Objective  Vitals:   10/25/19 0813  BP: 110/80  Pulse: 99   Resp: 16  Temp: (!) 96.8 F (36 C)  TempSrc: Temporal  SpO2: 98%  Weight: 177 lb 14.4 oz (80.7 kg)  Height: 5\' 2"  (1.575 m)    Body mass index is 32.54 kg/m.  Physical Exam  Constitutional: Patient appears well-developed and well-nourished. Obese No distress.  HEENT: head atraumatic, normocephalic, pupils equal and reactive to light Cardiovascular: Normal rate, regular rhythm and normal heart sounds.  No murmur heard. No BLE edema. Pulmonary/Chest: Effort normal and breath sounds normal. No respiratory distress. Abdominal: Soft.  There is no tenderness. Skin: keratosis pilaris  Psychiatric: Patient has a normal mood and affect. behavior is normal. Judgment and thought content normal.  Recent Results (from the past 2160 hour(s))  GC/Chlamydia probe amp ([place]) not at [facility]     Status: None   Collection Time: 09/21/19 12:00 AM  Result Value Ref Range   Chlamydia Negative     Comment: Normal Reference Range - Negative   Neisseria Gonorrhea Negative     Comment: Normal Reference Range - Negative  Wet prep, genital     Status: Abnormal   Collection Time: 09/21/19 11:11 AM   Specimen: PATH Cytology Cervicovaginal Ancillary Only  Result Value Ref Range   Yeast Wet Prep HPF POC NONE SEEN NONE SEEN   Trich, Wet Prep NONE SEEN NONE SEEN   Clue Cells Wet Prep HPF POC PRESENT (A) NONE SEEN   WBC, Wet Prep HPF POC FEW (A) NONE SEEN   Sperm NONE SEEN     Comment: Performed at [facility], [address], [place], [place] [address]  Comprehensive metabolic panel     Status: Abnormal   Collection Time: 09/21/19 11:21 AM  Result Value Ref Range   Sodium 139 135 - 145 mmol/L   Potassium 3.8 3.5 - 5.1 mmol/L   Chloride 102 98 - 111 mmol/L   CO2 27 22 - 32 mmol/L   Glucose, Bld 101 (H) 70 - 99 mg/dL   BUN 18 6 - 20 mg/dL   Creatinine, Ser 0.68 0.44 - 1.00 mg/dL   Calcium 9.1 8.9 - 10.3 mg/dL   Total Protein 7.3 6.5 - 8.1 g/dL   Albumin 4.2 3.5 - 5.0 g/dL   AST 21 15 - 41 U/L    ALT 19 0 - 44 U/L   Alkaline Phosphatase 82 38 - 126 U/L   Total Bilirubin 0.6 0.3 - 1.2 mg/dL   GFR calc non Af Amer >60 >60 mL/min   GFR calc Af Amer >60 >60 mL/min   Anion gap 10 5 - 15    Comment: Performed at [facility], [address], [place], [place] [address]  Lipase, blood     Status: None   Collection Time: 09/21/19 11:21 AM  Result Value Ref Range   Lipase 22 11 - 51 U/L    Comment: Performed at [facility], [address], [place], [place] [address]  CBC with Differential     Status: None   Collection Time: 09/21/19 11:21 AM  Result Value Ref Range   WBC 7.9 4.0 - 10.5 K/uL   RBC 4.78 3.87 - 5.11 MIL/uL   Hemoglobin 14.6 12.0 - 15.0 g/dL   HCT 11.1 73.5 - 67.0 %   MCV 94.8 80.0 - 100.0 fL   MCH 30.5 26.0 - 34.0 pg   MCHC 32.2 30.0 - 36.0 g/dL   RDW 14.1 03.0 - 13.1 %   Platelets 275 150 - 400 K/uL   nRBC 0.0 0.0 - 0.2 %   Neutrophils Relative % 56 %   Neutro Abs 4.4 1.7 - 7.7 K/uL   Lymphocytes Relative 38 %   Lymphs Abs 3.0 0.7 - 4.0 K/uL   Monocytes Relative 5 %   Monocytes Absolute 0.4 0.1 - 1.0 K/uL   Eosinophils Relative 1 %   Eosinophils Absolute 0.1 0.0 - 0.5 K/uL   Basophils Relative 0 %   Basophils Absolute 0.0 0.0 - 0.1 K/uL   Immature Granulocytes 0 %   Abs Immature Granulocytes 0.02 0.00 - 0.07 K/uL    Comment: Performed at [facility], [address], [place], [place] [address]  Pregnancy, urine     Status: None   Collection Time: 09/21/19 12:37 PM  Result Value Ref Range   Preg Test, Ur NEGATIVE NEGATIVE    Comment:        THE SENSITIVITY OF THIS METHODOLOGY IS >20 mIU/mL. Performed at [facility], [address], [place], [place] [address]   Urinalysis, Routine w reflex microscopic     Status: None   Collection Time: 09/21/19 12:37 PM  Result Value Ref Range   Color, Urine YELLOW YELLOW   APPearance CLEAR CLEAR   Specific Gravity, Urine 1.025 1.005 - 1.030   pH 6.0 5.0 - 8.0   Glucose, UA NEGATIVE NEGATIVE mg/dL   Hgb urine  dipstick NEGATIVE NEGATIVE   Bilirubin Urine NEGATIVE NEGATIVE   Ketones, ur NEGATIVE NEGATIVE mg/dL   Protein, ur NEGATIVE NEGATIVE mg/dL   Nitrite NEGATIVE NEGATIVE   Leukocytes,Ua NEGATIVE NEGATIVE    Comment: Performed at [facility], [address], [place], [place] [address]     PHQ2/9: Depression screen [facility] 2/9 10/25/2019 04/26/2019 10/24/2018  Decreased Interest 0 0 0  Down, Depressed, Hopeless 0 0 0  PHQ - 2 Score 0 0 0  Altered sleeping 0 0 0  Tired, decreased energy 0 0 0  Change in appetite 0 0 0  Feeling bad or failure about yourself  0 0 0  Trouble concentrating 0 0 0  Moving slowly or fidgety/restless 0 0 0  Suicidal thoughts 0 0 0  PHQ-9 Score 0 0 0  Difficult doing work/chores - - Not difficult at all    phq 9 is negative   Fall Risk: Fall Risk  10/25/2019 04/26/2019 10/24/2018  Falls in the past year? 0 0 0  Number falls in past yr: 0 0 0  Injury with Fall? 0 0 0    Functional Status Survey: Is the patient deaf or have difficulty hearing?: No Does the patient have difficulty seeing, even when wearing glasses/contacts?: No Does the patient have difficulty concentrating, remembering, or making decisions?: No Does the patient have difficulty walking or climbing stairs?: No Does the patient have difficulty dressing or bathing?: No Does the patient have difficulty doing errands alone such as visiting a doctor's office or shopping?: No    Assessment & Plan  1. Vitamin D deficiency  - Vitamin D, Ergocalciferol, (DRISDOL) 1.25 MG (50000 UNIT) CAPS capsule; Take  1 capsule (50,000 Units total) by mouth every 7 (seven) days.  Dispense: 12 capsule; Refill: 4 - VITAMIN D 25 Hydroxy (Vit-D Deficiency, Fractures)  2. Pure hypercholesterolemia  - Lipid panel  3. Keratosis pilaris   4. History of asthma  No symptoms   5. History of recent fall  Slipped in ice  6. History of concussion  No symptoms now   7. Screening for diabetes mellitus  -  Hemoglobin A1c  8. Elevated serum free T4 level  - Thyroid Panel With TSH  9. Routine screening for STI (sexually transmitted infection)  - HIV Antibody (routine testing w rflx) - RPR  10. Enlarged thyroid  - US THYROID; Future

## 2019-10-26 ENCOUNTER — Encounter: Payer: Self-pay | Admitting: Family Medicine

## 2019-10-26 LAB — HEMOGLOBIN A1C
Hgb A1c MFr Bld: 5.4 % of total Hgb (ref <5.7)
Mean Plasma Glucose: 108 (calc)
eAG (mmol/L): 6 (calc)

## 2019-10-26 LAB — LIPID PANEL
Cholesterol: 203 mg/dL — ABNORMAL HIGH (ref <200)
HDL: 52 mg/dL (ref >=50)
LDL Cholesterol (Calc): 131 mg/dL (calc) — ABNORMAL HIGH
Non-HDL Cholesterol (Calc): 151 mg/dL (calc) — ABNORMAL HIGH (ref <130)
Total CHOL/HDL Ratio: 3.9 (calc) (ref <5.0)
Triglycerides: 101 mg/dL (ref <150)

## 2019-10-26 LAB — THYROID PANEL WITH TSH
Free Thyroxine Index: 1.7 (ref 1.4–3.8)
T3 Uptake: 33 % (ref 22–35)
T4, Total: 5.3 ug/dL (ref 5.1–11.9)
TSH: 1.34 mIU/L

## 2019-10-26 LAB — RPR: RPR Ser Ql: NONREACTIVE

## 2019-10-26 LAB — VITAMIN D 25 HYDROXY (VIT D DEFICIENCY, FRACTURES): Vit D, 25-Hydroxy: 26 ng/mL — ABNORMAL LOW (ref 30–100)

## 2019-10-26 LAB — HIV ANTIBODY (ROUTINE TESTING W REFLEX): HIV 1&2 Ab, 4th Generation: NONREACTIVE

## 2019-10-31 ENCOUNTER — Ambulatory Visit: Payer: BC Managed Care – PPO

## 2019-10-31 ENCOUNTER — Telehealth: Payer: Self-pay | Admitting: Certified Nurse Midwife

## 2019-10-31 NOTE — Telephone Encounter (Signed)
Pt called in and stated that she has an ultrasound and her insurance isnt going to cover it. The pt wanted to know if we had ultrasound tech her in office . I told her yes . The pt stated would she have an ultrasound here I told  The pt it would be up to the provider to order the ultrasound.

## 2019-11-01 ENCOUNTER — Ambulatory Visit: Payer: BC Managed Care – PPO

## 2019-11-16 ENCOUNTER — Other Ambulatory Visit: Payer: Self-pay

## 2019-11-16 ENCOUNTER — Encounter: Payer: Self-pay | Admitting: Certified Nurse Midwife

## 2019-11-16 ENCOUNTER — Other Ambulatory Visit (HOSPITAL_COMMUNITY)
Admission: RE | Admit: 2019-11-16 | Discharge: 2019-11-16 | Disposition: A | Discharge status: Home / Self Care | Payer: BC Managed Care – PPO | Source: Ambulatory Visit | Attending: Certified Nurse Midwife | Admitting: Certified Nurse Midwife

## 2019-11-16 ENCOUNTER — Ambulatory Visit (INDEPENDENT_AMBULATORY_CARE_PROVIDER_SITE_OTHER): Payer: BC Managed Care – PPO | Admitting: Certified Nurse Midwife

## 2019-11-16 VITALS — BP 132/83 | HR 92 | Ht 62.0 in | Wt 178.0 lb

## 2019-11-16 DIAGNOSIS — Z01419 Encounter for gynecological examination (general) (routine) without abnormal findings: Secondary | ICD-10-CM | POA: Diagnosis present

## 2019-11-16 DIAGNOSIS — E559 Vitamin D deficiency, unspecified: Secondary | ICD-10-CM

## 2019-11-16 DIAGNOSIS — E049 Nontoxic goiter, unspecified: Secondary | ICD-10-CM

## 2019-11-16 DIAGNOSIS — Z124 Encounter for screening for malignant neoplasm of cervix: Secondary | ICD-10-CM

## 2019-11-16 DIAGNOSIS — Z30431 Encounter for routine checking of intrauterine contraceptive device: Secondary | ICD-10-CM | POA: Diagnosis not present

## 2019-11-16 DIAGNOSIS — Z975 Presence of (intrauterine) contraceptive device: Secondary | ICD-10-CM | POA: Insufficient documentation

## 2019-11-16 DIAGNOSIS — L858 Other specified epidermal thickening: Secondary | ICD-10-CM

## 2019-11-16 MED ORDER — VITAMIN D (ERGOCALCIFEROL) 1.25 MG (50000 UNIT) PO CAPS: 50000.0000 [IU] | ORAL_CAPSULE | ORAL | 4 refills | Status: DC

## 2019-11-16 MED ORDER — TRIAMCINOLONE ACETONIDE 0.1 % EX CREA: 1.0000 "application " | TOPICAL_CREAM | Freq: Two times a day (BID) | CUTANEOUS | 1 refills | Status: DC | PRN

## 2019-11-16 NOTE — Patient Instructions (Signed)
Levonorgestrel intrauterine device (IUD) What is this medicine? LEVONORGESTREL IUD (LEE voe nor jes trel) is a contraceptive (birth control) device. The device is placed inside the uterus by a healthcare professional. It is used to prevent pregnancy. This device can also be used to treat heavy bleeding that occurs during your period. This medicine may be used for other purposes; ask your health care provider or pharmacist if you have questions. COMMON BRAND NAME(S): Minette Headland What should I tell my health care provider before I take this medicine? They need to know if you have any of these conditions:  abnormal Pap smear  cancer of the breast, uterus, or cervix  diabetes  endometritis  genital or pelvic infection now or in the past  have more than one sexual partner or your partner has more than one partner  heart disease  history of an ectopic or tubal pregnancy  immune system problems  IUD in place  liver disease or tumor  problems with blood clots or take blood-thinners  seizures  use intravenous drugs  uterus of unusual shape  vaginal bleeding that has not been explained  an unusual or allergic reaction to levonorgestrel, other hormones, silicone, or polyethylene, medicines, foods, dyes, or preservatives  pregnant or trying to get pregnant  breast-feeding How should I use this medicine? This device is placed inside the uterus by a health care professional. Talk to your pediatrician regarding the use of this medicine in children. Special care may be needed. Overdosage: If you think you have taken too much of this medicine contact a poison control center or emergency room at once. NOTE: This medicine is only for you. Do not share this medicine with others. What if I miss a dose? This does not apply. Depending on the brand of device you have inserted, the device will need to be replaced every 3 to 6 years if you wish to continue using this type  of birth control. What may interact with this medicine? Do not take this medicine with any of the following medications:  amprenavir  bosentan  fosamprenavir This medicine may also interact with the following medications:  aprepitant  armodafinil  barbiturate medicines for inducing sleep or treating seizures  bexarotene  boceprevir  griseofulvin  medicines to treat seizures like carbamazepine, ethotoin, felbamate, oxcarbazepine, phenytoin, topiramate  modafinil  pioglitazone  rifabutin  rifampin  rifapentine  some medicines to treat HIV infection like atazanavir, efavirenz, indinavir, lopinavir, nelfinavir, tipranavir, ritonavir  St. John's wort  warfarin This list may not describe all possible interactions. Give your health care provider a list of all the medicines, herbs, non-prescription drugs, or dietary supplements you use. Also tell them if you smoke, drink alcohol, or use illegal drugs. Some items may interact with your medicine. What should I watch for while using this medicine? Visit your doctor or health care professional for regular check ups. See your doctor if you or your partner has sexual contact with others, becomes HIV positive, or gets a sexual transmitted disease. This product does not protect you against HIV infection (AIDS) or other sexually transmitted diseases. You can check the placement of the IUD yourself by reaching up to the top of your vagina with clean fingers to feel the threads. Do not pull on the threads. It is a good habit to check placement after each menstrual period. Call your doctor right away if you feel more of the IUD than just the threads or if you cannot feel the threads at  all. The IUD may come out by itself. You may become pregnant if the device comes out. If you notice that the IUD has come out use a backup birth control method like condoms and call your health care provider. Using tampons will not change the position of the  IUD and are okay to use during your period. This IUD can be safely scanned with magnetic resonance imaging (MRI) only under specific conditions. Before you have an MRI, tell your healthcare provider that you have an IUD in place, and which type of IUD you have in place. What side effects may I notice from receiving this medicine? Side effects that you should report to your doctor or health care professional as soon as possible:  allergic reactions like skin rash, itching or hives, swelling of the face, lips, or tongue  fever, flu-like symptoms  genital sores  high blood pressure  no menstrual period for 6 weeks during use  pain, swelling, warmth in the leg  pelvic pain or tenderness  severe or sudden headache  signs of pregnancy  stomach cramping  sudden shortness of breath  trouble with balance, talking, or walking  unusual vaginal bleeding, discharge  yellowing of the eyes or skin Side effects that usually do not require medical attention (report to your doctor or health care professional if they continue or are bothersome):  acne  breast pain  change in sex drive or performance  changes in weight  cramping, dizziness, or faintness while the device is being inserted  headache  irregular menstrual bleeding within first 3 to 6 months of use  nausea This list may not describe all possible side effects. Call your doctor for medical advice about side effects. You may report side effects to FDA at 1-800-FDA-1088. Where should I keep my medicine? This does not apply. NOTE: This sheet is a summary. It may not cover all possible information. If you have questions about this medicine, talk to your doctor, pharmacist, or health care provider.  2020 Elsevier/Gold Standard (2018-05-31 13:22:01)   Preventive Care 21-39 Years Old, Female Preventive care refers to visits with your health care provider and lifestyle choices that can promote health and wellness. This  includes:  A yearly physical exam. This may also be called an annual well check.  Regular dental visits and eye exams.  Immunizations.  Screening for certain conditions.  Healthy lifestyle choices, such as eating a healthy diet, getting regular exercise, not using drugs or products that contain nicotine and tobacco, and limiting alcohol use. What can I expect for my preventive care visit? Physical exam Your health care provider will check your:  Height and weight. This may be used to calculate body mass index (BMI), which tells if you are at a healthy weight.  Heart rate and blood pressure.  Skin for abnormal spots. Counseling Your health care provider may ask you questions about your:  Alcohol, tobacco, and drug use.  Emotional well-being.  Home and relationship well-being.  Sexual activity.  Eating habits.  Work and work environment.  Method of birth control.  Menstrual cycle.  Pregnancy history. What immunizations do I need?  Influenza (flu) vaccine  This is recommended every year. Tetanus, diphtheria, and pertussis (Tdap) vaccine  You may need a Td booster every 10 years. Varicella (chickenpox) vaccine  You may need this if you have not been vaccinated. Human papillomavirus (HPV) vaccine  If recommended by your health care provider, you may need three doses over 6 months. Measles, mumps, and   rubella (MMR) vaccine  You may need at least one dose of MMR. You may also need a second dose. Meningococcal conjugate (MenACWY) vaccine  One dose is recommended if you are age 19-21 years and a first-year college student living in a residence hall, or if you have one of several medical conditions. You may also need additional booster doses. Pneumococcal conjugate (PCV13) vaccine  You may need this if you have certain conditions and were not previously vaccinated. Pneumococcal polysaccharide (PPSV23) vaccine  You may need one or two doses if you smoke  cigarettes or if you have certain conditions. Hepatitis A vaccine  You may need this if you have certain conditions or if you travel or work in places where you may be exposed to hepatitis A. Hepatitis B vaccine  You may need this if you have certain conditions or if you travel or work in places where you may be exposed to hepatitis B. Haemophilus influenzae type b (Hib) vaccine  You may need this if you have certain conditions. You may receive vaccines as individual doses or as more than one vaccine together in one shot (combination vaccines). Talk with your health care provider about the risks and benefits of combination vaccines. What tests do I need?  Blood tests  Lipid and cholesterol levels. These may be checked every 5 years starting at age 20.  Hepatitis C test.  Hepatitis B test. Screening  Diabetes screening. This is done by checking your blood sugar (glucose) after you have not eaten for a while (fasting).  Sexually transmitted disease (STD) testing.  BRCA-related cancer screening. This may be done if you have a family history of breast, ovarian, tubal, or peritoneal cancers.  Pelvic exam and Pap test. This may be done every 3 years starting at age 21. Starting at age 30, this may be done every 5 years if you have a Pap test in combination with an HPV test. Talk with your health care provider about your test results, treatment options, and if necessary, the need for more tests. Follow these instructions at home: Eating and drinking   Eat a diet that includes fresh fruits and vegetables, whole grains, lean protein, and low-fat dairy.  Take vitamin and mineral supplements as recommended by your health care provider.  Do not drink alcohol if: ? Your health care provider tells you not to drink. ? You are pregnant, may be pregnant, or are planning to become pregnant.  If you drink alcohol: ? Limit how much you have to 0-1 drink a day. ? Be aware of how much alcohol  is in your drink. In the [place] one drink equals one 12 oz bottle of beer (355 mL), one 5 oz glass of wine (148 mL), or one 1 oz glass of hard liquor (44 mL). Lifestyle  Take daily care of your teeth and gums.  Stay active. Exercise for at least 30 minutes on 5 or more days each week.  Do not use any products that contain nicotine or tobacco, such as cigarettes, e-cigarettes, and chewing tobacco. If you need help quitting, ask your health care provider.  If you are sexually active, practice safe sex. Use a condom or other form of birth control (contraception) in order to prevent pregnancy and STIs (sexually transmitted infections). If you plan to become pregnant, see your health care provider for a preconception visit. What's next?  Visit your health care provider once a year for a well check visit.  Ask your health care provider how   how often you should have your eyes and teeth checked.  Stay up to date on all vaccines. This information is not intended to replace advice given to you by your health care provider. Make sure you discuss any questions you have with your health care provider. Document Revised: 03/31/2018 Document Reviewed: 03/31/2018 Elsevier Patient Education  2020 [employer].

## 2019-11-16 NOTE — Progress Notes (Signed)
ANNUAL PREVENTATIVE CARE GYN  ENCOUNTER NOTE  Subjective:       Sierra Clay is a 29 y.o. [phone] female here for a routine annual gynecologic exam.  Current complaints: 1. Needs Pap smears 2. Wishes to wean youngest child  Denies difficulty breathing or respiratory distress, chest pain, abdominal pain, excessive vaginal bleeding, dysuria, and leg pain or swelling.    Gynecologic History  No LMP recorded. (Menstrual status: IUD).  Contraception: IUD, Mirena  Last Pap: 2018. Results were: normal  Obstetric History  OB History  Gravida Para Term Preterm AB Living  3 2 2   1 2   SAB TAB Ectopic Multiple Live Births  1     0 2    # Outcome Date GA Lbr Len/2nd Weight Sex Delivery Anes PTL Lv  3 Term 12/26/17 [redacted]w[redacted]d / 01:53 7 lb 4.4 oz (3.3 kg) M Vag-Vacuum EPI  LIV  2 Term 01/05/10   7 lb 2 oz (3.232 kg) F Vag-Spont  N LIV  1 SAB             Past Medical History:  Diagnosis Date  . Asthma    Mild  . History of abnormal cervical Pap smear 2012, 2013, 2016  . Hypercholesteremia     Past Surgical History:  Procedure Laterality Date  . NO PAST SURGERIES      No current outpatient medications on file prior to visit.   Current Facility-Administered Medications on File Prior to Visit  Medication Dose Route Frequency Provider Last Rate Last Admin  . levonorgestrel (MIRENA) 20 MCG/24HR IUD   Intrauterine Once Schuman, 30-19-1977, MD        No Known Allergies  Social History   Socioeconomic History  . Marital status: Jaquelyn Bitter    Spouse name: Media planner  . Number of children: 2  . Years of education: Not on file  . Highest education level: Bachelor's degree (e.g., BA, AB, BS)  Occupational History  . Occupation: [employer]     Comment: AP and environmental science   Tobacco Use  . Smoking status: Never Smoker  . Smokeless tobacco: Never Used  Substance and Sexual Activity  . Alcohol use: No  . Drug use: No  . Sexual activity: Yes    Partners: Male    Birth control/protection: I.U.D.  Other Topics Concern  . 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 Difficulty of Paying Living Expenses:   Food Insecurity:   . Worried About Corporate investment banker in the Last Year:   . Programme researcher, broadcasting/film/video in the Last Year:   Transportation Needs:   . Barista (Medical):   Freight forwarder Lack of Transportation (Non-Medical):   Physical Activity:   . Days of Exercise per Week:   . Minutes of Exercise per Session:   Stress:   . Feeling of Stress :   Social Connections:   . Frequency of Communication with Friends and Family:   . Frequency of Social Gatherings with Friends and Family:   . Attends Religious Services:   . Active Member of Clubs or Organizations:   . Attends [employer] Meetings:   Banker Marital Status:   Intimate Partner Violence:   .  Fear of Current or Ex-Partner:   . Emotionally Abused:   [employer] Physically Abused:   . Sexually Abused:     Family History  Problem Relation Age of Onset  . Diabetes Paternal Grandmother   . Cancer Maternal Grandmother   . Hypercholesterolemia Mother   . Hypercholesterolemia Father   . Diabetes Brother     The following portions of the patient's history were reviewed and updated as appropriate: allergies, current medications, past family history, past medical history, past social history, past surgical history and problem list.  Review of Systems  ROS negative except as noted above. Information obtained from patient.    Objective:   BP 132/83   Pulse 92   Ht 5\' 2"  (1.575 m)   Wt 178 lb (80.7 kg)   BMI 32.56 kg/m    CONSTITUTIONAL: Well-developed, well-nourished female in no acute distress.   PSYCHIATRIC: Normal mood and affect. Normal behavior. Normal judgment and  thought content.  [place]: Alert and oriented to person, place, and time. Normal muscle tone coordination. No cranial nerve deficit noted.  HENT:  Normocephalic, atraumatic, External right and left ear normal.   EYES: Conjunctivae and EOM are normal. Pupils are equal and round.   NECK: Normal range of motion, supple, no masses.  Enlarged thyroid.   SKIN: Skin is warm and dry. No rash noted. Not diaphoretic. No erythema. No pallor.  CARDIOVASCULAR: Normal heart rate noted, regular rhythm, no murmur.  RESPIRATORY: Clear to auscultation bilaterally. Effort and breath sounds normal, no problems with respiration noted.  BREASTS: Symmetric in size. No masses, skin changes, nipple drainage, or lymphadenopathy.  ABDOMEN: Soft, normal bowel sounds, no distention noted.  No tenderness, rebound or guarding.   PELVIC:  External Genitalia: Normal  Vagina: Normal  Cervix: Normal, IUD strings present, Pap collected  Uterus: Normal  Adnexa: Normal    MUSCULOSKELETAL: Normal range of motion. No tenderness.  No cyanosis, clubbing, or edema.  2+ distal pulses.  LYMPHATIC: No Axillary, Supraclavicular, or Inguinal Adenopathy.  Assessment:   Annual gynecologic examination 29 y.o.   Contraception: IUD, Mirena   Obesity 1   Problem List Items Addressed This Visit      Other   Vitamin D deficiency   Relevant Medications   Vitamin D, Ergocalciferol, (DRISDOL) 1.25 MG (50000 UNIT) CAPS capsule    Other Visit Diagnoses    Well woman exam    -  Primary   Relevant Orders   Ambulatory referral to Endocrinology   Cytology - PAP   IUD check up       Enlarged thyroid       Relevant Orders   Ambulatory referral to Endocrinology   IUD (intrauterine device) in place       Relevant Orders   Cytology - PAP   Keratosis pilaris       Relevant Medications   triamcinolone cream (KENALOG) 0.1 %   Screening for cervical cancer       Relevant Orders   Cytology - PAP      Plan:   Pap: Pap,  Reflex if ASCUS  Labs: Managed by PCP  Rx Vitamin D and Kenalog, see orders  Routine preventative health maintenance measures emphasized: Exercise/Diet/Weight control, Tobacco Warnings, Alcohol/Substance use risks and Stress Management; see AVS  Referral sent to Lactation, see chart  Referral to Endocrinology, see orders  Reviewed red flag symptoms and when to call  Return to Clinic - 1 Year for [employer] or sooner if needed   [employer]  Serafina Royals, CNM [facility], [facility]

## 2019-11-17 ENCOUNTER — Other Ambulatory Visit: Admission: RE | Admit: 2019-11-17 | Payer: BC Managed Care – PPO | Source: Home / Self Care

## 2019-11-20 ENCOUNTER — Ambulatory Visit
Admission: RE | Admit: 2019-11-20 | Discharge: 2019-11-20 | Disposition: A | Discharge status: Home / Self Care | Payer: BC Managed Care – PPO | Source: Ambulatory Visit | Attending: Family Medicine | Admitting: Family Medicine

## 2019-11-20 ENCOUNTER — Other Ambulatory Visit: Payer: Self-pay

## 2019-11-20 DIAGNOSIS — E049 Nontoxic goiter, unspecified: Secondary | ICD-10-CM | POA: Diagnosis not present

## 2019-11-21 LAB — CYTOLOGY - PAP
Comment: NEGATIVE
Diagnosis: UNDETERMINED — AB
High risk HPV: NEGATIVE

## 2019-11-22 ENCOUNTER — Other Ambulatory Visit: Payer: Self-pay | Admitting: Family Medicine

## 2019-11-22 DIAGNOSIS — R9389 Abnormal findings on diagnostic imaging of other specified body structures: Secondary | ICD-10-CM

## 2019-11-22 NOTE — Progress Notes (Unsigned)
ab 

## 2019-11-30 ENCOUNTER — Telehealth: Payer: Self-pay

## 2019-11-30 NOTE — Telephone Encounter (Signed)
Lactation received a referral from [facility] for [facility] regarding weaning. LC called Maisee and she is interested in scheduling a lactation consultation to discuss the weaning process. Virtual appointment was scheduled for 12/07/19 at 3:00pm.

## 2019-12-07 ENCOUNTER — Other Ambulatory Visit: Payer: Self-pay

## 2019-12-07 ENCOUNTER — Ambulatory Visit
Admission: RE | Admit: 2019-12-07 | Discharge: 2019-12-07 | Disposition: A | Discharge status: Home / Self Care | Payer: BC Managed Care – PPO | Source: Ambulatory Visit | Attending: Certified Nurse Midwife | Admitting: Certified Nurse Midwife

## 2019-12-07 NOTE — Lactation Note (Signed)
Lactation Consultation Note  Patient Name: Sierra Clay Date: 12/07/2019     Maternal Data    Feeding    LATCH Score                   Interventions    Lactation Tools Discussed/Used     Consult Status  Lactation consultant spoke with Sierra Clay about weaning. Sierra Clay states that her 30 year old son is still nursing at night. He does not nurse any during the day. She also states that her toddler will go to sleep with his dad and aunt but will not fall asleep with her without nursing. Lactation consultant suggested that she could tape her nipples to assist with weaning and replace the nursing session with another comfort measure such as, snuggles, reading a bed time story, and offering another drink such as water. LC also discussed gentle weaning methods as well as how to avoid engorgement and mastitis by pumping for relief instead of just stopping abruptly. Sierra Clay stated that she would try these methods and denies any other questions or concerns at this time.    Arlyss Gandy 12/07/2019, 2:57 PM

## 2020-10-22 NOTE — Progress Notes (Signed)
Name: Sierra Clay   MRN: [id number]    DOB: 06-May-1991   Date:10/25/2020       Progress Note  Subjective  Chief Complaint  Chief Complaint  Patient presents with  . Follow-up    HPI  Rectal bleeding: patient states that she was diagnosed with hemorrhoids during her pregnancy 14 years ago, she had been well until earlier this year when she noticed some intermittent rectal bleeding, rectal pain ( feels like something is cutting her during a bowel movement), constipation, having bowel movements a couple of times a week and is [place] 1-3. She tries not to strain, it is painful. She has tried Miralax without help. She denies weight loss or change in appetite  Keratosis pilaris and rash on neck: she used to have triamcinolone but has been out of medication, she states both rashes improves with medications.   Vitamin D deficiency: taking weekly supplementation  Obesity: she was referred to Dr. Gershon Crane for goiter and she is now taking phentermine but not losing much weight, the plan is to try GLP 1 agonist soon  Hypercholesterolemia: we will recheck labs, reminded her of a healthy diet  Patient Active Problem List   Diagnosis Date Noted  . History of asthma 10/24/2018  . Pure hypercholesterolemia 10/24/2018  . Obesity (BMI 30.0-34.9) 10/24/2018  . Vitamin D deficiency 10/24/2018    Past Surgical History:  Procedure Laterality Date  . NO PAST SURGERIES      Family History  Problem Relation Age of Onset  . Diabetes Paternal Grandmother   . Cancer Maternal Grandmother   . Hypercholesterolemia Mother   . Hypercholesterolemia Father   . Diabetes Brother     Social History   Tobacco Use  . Smoking status: Never Smoker  . Smokeless tobacco: Never Used  Substance Use Topics  . Alcohol use: No     Current Outpatient Medications:  .  hydrocortisone (ANUSOL-HC) 25 MG suppository, Place 1 suppository (25 mg total) rectally 2 (two) times daily., Disp: 12 suppository, Rfl:  0 .  lubiprostone (AMITIZA) 24 MCG capsule, Take 1 capsule (24 mcg total) by mouth 2 (two) times daily with a meal., Disp: 60 capsule, Rfl: 2 .  NIFEdipine Micronized POWD, Place 1 each rectally daily., Disp: 5 g, Rfl: 0 .  phentermine (ADIPEX-P) 37.5 MG tablet, Take 1 tablet by mouth daily., Disp: , Rfl:  .  Vitamin D, Ergocalciferol, (DRISDOL) 1.25 MG (50000 UNIT) CAPS capsule, Take 1 capsule (50,000 Units total) by mouth every 7 (seven) days., Disp: 12 capsule, Rfl: 4 .  triamcinolone (KENALOG) 0.1 %, Apply 1 application topically 2 (two) times daily as needed. Mixed with lotion, Disp: 45 g, Rfl: 1  Current Facility-Administered Medications:  .  levonorgestrel (MIRENA) 20 MCG/24HR IUD, , Intrauterine, Once, Schuman, Christanna R, MD  No Known Allergies  I personally reviewed active problem list, medication list, allergies, family history, social history, health maintenance with the patient/caregiver today.   ROS  Ten systems reviewed and is negative except as mentioned in HPI   Objective  Vitals:   10/25/20 1551  BP: 122/64  Pulse: 87  Resp: 16  Temp: 98.1 F (36.7 C)  SpO2: 99%  Weight: 183 lb 9.6 oz (83.3 kg)  Height: 5\' 2"  (1.575 m)    Body mass index is 33.58 kg/m.  Physical Exam  Constitutional: Patient appears well-developed and well-nourished. Obese No distress.  HEENT: head atraumatic, normocephalic, pupils equal and reactive to light, , neck supple Cardiovascular: Normal rate, regular  rhythm and normal heart sounds.  No murmur heard. No BLE edema. Pulmonary/Chest: Effort normal and breath sounds normal. No respiratory distress. Abdominal: Soft.  There is no tenderness. Rectal: external hemorrhoids and skin tag, very tender during digital exam, sharp pain at 6 o'clock position, no blood  Psychiatric: Patient has a normal mood and affect. behavior is normal. Judgment and thought content normal.  PHQ2/9: Depression screen [facility] 2/9 10/25/2020 10/25/2019 04/26/2019  10/24/2018  Decreased Interest 0 0 0 0  Down, Depressed, Hopeless 0 0 0 0  PHQ - 2 Score 0 0 0 0  Altered sleeping 0 0 0 0  Tired, decreased energy 0 0 0 0  Change in appetite 0 0 0 0  Feeling bad or failure about yourself  0 0 0 0  Trouble concentrating 0 0 0 0  Moving slowly or fidgety/restless 0 0 0 0  Suicidal thoughts 0 0 0 0  PHQ-9 Score 0 0 0 0  Difficult doing work/chores Not difficult at all - - Not difficult at all    phq 9 is negative   Fall Risk: Fall Risk  10/25/2020 10/25/2019 04/26/2019 10/24/2018  Falls in the past year? 1 0 0 0  Number falls in past yr: 0 0 0 0  Injury with Fall? 0 0 0 0     Functional Status Survey: Is the patient deaf or have difficulty hearing?: No Does the patient have difficulty seeing, even when wearing glasses/contacts?: No Does the patient have difficulty concentrating, remembering, or making decisions?: No Does the patient have difficulty walking or climbing stairs?: No Does the patient have difficulty dressing or bathing?: No Does the patient have difficulty doing errands alone such as visiting a doctor's office or shopping?: No    Assessment & Plan   1. Constipation, unspecified constipation type  - CBC with Differential/Platelet - COMPLETE METABOLIC PANEL WITH GFR - lubiprostone (AMITIZA) 24 MCG capsule; Take 1 capsule (24 mcg total) by mouth 2 (two) times daily with a meal.  Dispense: 60 capsule; Refill: 2  2. Vitamin D deficiency  - VITAMIN D 25 Hydroxy (Vit-D Deficiency, Fractures)  3. Keratosis pilaris  - triamcinolone (KENALOG) 0.1 %; Apply 1 application topically 2 (two) times daily as needed. Mixed with lotion  Dispense: 45 g; Refill: 1  4. Pure hypercholesterolemia  - Lipid panel  5. Need for hepatitis C screening test  - Hepatitis C antibody  6. Screening for diabetes mellitus  - Hemoglobin A1c  7. Goiter   8. Obesity (BMI 30.0-34.9)  Discussed with the patient the risk posed by an increased BMI.  Discussed importance of portion control, calorie counting and at least 150 minutes of physical activity weekly. Avoid sweet beverages and drink more water. Eat at least 6 servings of fruit and vegetables daily   9. Change in bowel movement  Likely secondary to rectal pain  10. Rectal bleeding   11. Hemorrhoids, unspecified hemorrhoid type  - hydrocortisone (ANUSOL-HC) 25 MG suppository; Place 1 suppository (25 mg total) rectally 2 (two) times daily.  Dispense: 12 suppository; Refill: 0  12. Rectal fissure  - hydrocortisone (ANUSOL-HC) 25 MG suppository; Place 1 suppository (25 mg total) rectally 2 (two) times daily.  Dispense: 12 suppository; Refill: 0 - NIFEdipine Micronized POWD; Place 1 each rectally daily.  Dispense: 5 g; Refill: 0 ( compound - patient was given rx to find a compound pharmacy near her

## 2020-10-25 ENCOUNTER — Encounter: Payer: Self-pay | Admitting: Family Medicine

## 2020-10-25 ENCOUNTER — Ambulatory Visit (INDEPENDENT_AMBULATORY_CARE_PROVIDER_SITE_OTHER): Payer: BC Managed Care – PPO | Admitting: Family Medicine

## 2020-10-25 ENCOUNTER — Other Ambulatory Visit: Payer: Self-pay

## 2020-10-25 VITALS — BP 122/64 | HR 87 | Temp 98.1°F | Resp 16 | Ht 62.0 in | Wt 183.6 lb

## 2020-10-25 DIAGNOSIS — L858 Other specified epidermal thickening: Secondary | ICD-10-CM

## 2020-10-25 DIAGNOSIS — K5900 Constipation, unspecified: Secondary | ICD-10-CM

## 2020-10-25 DIAGNOSIS — Z1159 Encounter for screening for other viral diseases: Secondary | ICD-10-CM

## 2020-10-25 DIAGNOSIS — Z131 Encounter for screening for diabetes mellitus: Secondary | ICD-10-CM

## 2020-10-25 DIAGNOSIS — E669 Obesity, unspecified: Secondary | ICD-10-CM

## 2020-10-25 DIAGNOSIS — R198 Other specified symptoms and signs involving the digestive system and abdomen: Secondary | ICD-10-CM

## 2020-10-25 DIAGNOSIS — E7800 Pure hypercholesterolemia, unspecified: Secondary | ICD-10-CM

## 2020-10-25 DIAGNOSIS — K625 Hemorrhage of anus and rectum: Secondary | ICD-10-CM

## 2020-10-25 DIAGNOSIS — K602 Anal fissure, unspecified: Secondary | ICD-10-CM

## 2020-10-25 DIAGNOSIS — E049 Nontoxic goiter, unspecified: Secondary | ICD-10-CM

## 2020-10-25 DIAGNOSIS — E559 Vitamin D deficiency, unspecified: Secondary | ICD-10-CM | POA: Diagnosis not present

## 2020-10-25 DIAGNOSIS — K649 Unspecified hemorrhoids: Secondary | ICD-10-CM

## 2020-10-25 MED ORDER — TRIAMCINOLONE ACETONIDE 0.1 % EX CREA: 1.0000 "application " | TOPICAL_CREAM | Freq: Two times a day (BID) | CUTANEOUS | 1 refills | Status: DC | PRN

## 2020-10-25 MED ORDER — HYDROCORTISONE ACETATE 25 MG RE SUPP: 25.0000 mg | Freq: Two times a day (BID) | RECTAL | 0 refills | Status: DC

## 2020-10-25 MED ORDER — NIFEDIPINE MICRONIZED POWD: 1.0000 | Freq: Every day | 0 refills | Status: DC

## 2020-10-25 MED ORDER — LUBIPROSTONE 24 MCG PO CAPS: 24.0000 ug | ORAL_CAPSULE | Freq: Two times a day (BID) | ORAL | 2 refills | Status: DC

## 2020-10-28 LAB — CBC WITH DIFFERENTIAL/PLATELET
Absolute Monocytes: 524 cells/uL (ref 200–950)
Basophils Absolute: 48 cells/uL (ref 0–200)
Basophils Relative: 0.4 %
Eosinophils Absolute: 95 cells/uL (ref 15–500)
Eosinophils Relative: 0.8 %
HCT: 41.4 % (ref 35.0–45.0)
Hemoglobin: 14 g/dL (ref 11.7–15.5)
Lymphs Abs: 3844 cells/uL (ref 850–3900)
MCH: 31.3 pg (ref 27.0–33.0)
MCHC: 33.8 g/dL (ref 32.0–36.0)
MCV: 92.6 fL (ref 80.0–100.0)
MPV: 10.1 fL (ref 7.5–12.5)
Monocytes Relative: 4.4 %
Neutro Abs: 7390 cells/uL (ref 1500–7800)
Neutrophils Relative %: 62.1 %
Platelets: 284 10*3/uL (ref 140–400)
RBC: 4.47 10*6/uL (ref 3.80–5.10)
RDW: 12 % (ref 11.0–15.0)
Total Lymphocyte: 32.3 %
WBC: 11.9 10*3/uL — ABNORMAL HIGH (ref 3.8–10.8)

## 2020-10-28 LAB — COMPLETE METABOLIC PANEL WITH GFR
AG Ratio: 1.6 (calc) (ref 1.0–2.5)
ALT: 15 U/L (ref 6–29)
AST: 20 U/L (ref 10–30)
Albumin: 4.3 g/dL (ref 3.6–5.1)
Alkaline phosphatase (APISO): 69 U/L (ref 31–125)
BUN: 11 mg/dL (ref 7–25)
CO2: 28 mmol/L (ref 20–32)
Calcium: 9.5 mg/dL (ref 8.6–10.2)
Chloride: 100 mmol/L (ref 98–110)
Creat: 0.74 mg/dL (ref 0.50–1.10)
GFR, Est African American: 126 mL/min/{1.73_m2} (ref >=60)
GFR, Est Non African American: 109 mL/min/{1.73_m2} (ref >=60)
Globulin: 2.7 g/dL (calc) (ref 1.9–3.7)
Glucose, Bld: 96 mg/dL (ref 65–99)
Potassium: 3.7 mmol/L (ref 3.5–5.3)
Sodium: 137 mmol/L (ref 135–146)
Total Bilirubin: 0.3 mg/dL (ref 0.2–1.2)
Total Protein: 7 g/dL (ref 6.1–8.1)

## 2020-10-28 LAB — HEPATITIS C ANTIBODY
Hepatitis C Ab: NONREACTIVE
SIGNAL TO CUT-OFF: 0.02 (ref <1.00)

## 2020-10-28 LAB — LIPID PANEL
Cholesterol: 234 mg/dL — ABNORMAL HIGH (ref <200)
HDL: 55 mg/dL (ref >=50)
LDL Cholesterol (Calc): 152 mg/dL (calc) — ABNORMAL HIGH
Non-HDL Cholesterol (Calc): 179 mg/dL (calc) — ABNORMAL HIGH (ref <130)
Total CHOL/HDL Ratio: 4.3 (calc) (ref <5.0)
Triglycerides: 141 mg/dL (ref <150)

## 2020-10-28 LAB — HEMOGLOBIN A1C
Hgb A1c MFr Bld: 5.3 % of total Hgb (ref <5.7)
Mean Plasma Glucose: 105 mg/dL
eAG (mmol/L): 5.8 mmol/L

## 2020-10-28 LAB — VITAMIN D 25 HYDROXY (VIT D DEFICIENCY, FRACTURES): Vit D, 25-Hydroxy: 52 ng/mL (ref 30–100)

## 2020-11-18 ENCOUNTER — Encounter: Payer: BC Managed Care – PPO | Admitting: Certified Nurse Midwife

## 2020-11-29 ENCOUNTER — Encounter: Payer: BC Managed Care – PPO | Admitting: Certified Nurse Midwife

## 2020-12-09 ENCOUNTER — Encounter: Payer: Self-pay | Admitting: Certified Nurse Midwife

## 2021-01-10 ENCOUNTER — Other Ambulatory Visit: Payer: Self-pay

## 2021-01-10 ENCOUNTER — Encounter: Payer: Self-pay | Admitting: Certified Nurse Midwife

## 2021-01-10 ENCOUNTER — Ambulatory Visit (INDEPENDENT_AMBULATORY_CARE_PROVIDER_SITE_OTHER): Payer: BC Managed Care – PPO | Admitting: Certified Nurse Midwife

## 2021-01-10 ENCOUNTER — Other Ambulatory Visit (HOSPITAL_COMMUNITY)
Admission: RE | Admit: 2021-01-10 | Discharge: 2021-01-10 | Disposition: A | Discharge status: Home / Self Care | Payer: BC Managed Care – PPO | Source: Ambulatory Visit | Attending: Certified Nurse Midwife | Admitting: Certified Nurse Midwife

## 2021-01-10 VITALS — BP 119/82 | HR 86 | Resp 16 | Ht 61.5 in | Wt 179.6 lb

## 2021-01-10 DIAGNOSIS — Z975 Presence of (intrauterine) contraceptive device: Secondary | ICD-10-CM | POA: Diagnosis present

## 2021-01-10 DIAGNOSIS — Z113 Encounter for screening for infections with a predominantly sexual mode of transmission: Secondary | ICD-10-CM | POA: Insufficient documentation

## 2021-01-10 DIAGNOSIS — Z01419 Encounter for gynecological examination (general) (routine) without abnormal findings: Secondary | ICD-10-CM | POA: Insufficient documentation

## 2021-01-10 DIAGNOSIS — R87610 Atypical squamous cells of undetermined significance on cytologic smear of cervix (ASC-US): Secondary | ICD-10-CM | POA: Diagnosis not present

## 2021-01-10 NOTE — Patient Instructions (Signed)
Levonorgestrel Intrauterine Device What is this medication? LEVONORGESTREL (LEE voe nor jes trel) prevents ovulation and pregnancy. It may also be used to treat heavy periods. It belongs to a group of medicationscalled contraceptives. This medication is a progestin hormone. This medicine may be used for other purposes; ask your health care provider orpharmacist if you have questions. COMMON BRAND NAME(S): Minette Headland What should I tell my care team before I take this medication? They need to know if you have any of these conditions: Abnormal Pap smear Cancer of the breast, uterus, or cervix Diabetes Endometritis Genital or pelvic infection now or in the past Have more than one sexual partner or your partner has more than one partner Heart disease History of an ectopic or tubal pregnancy Immune system problems IUD in place Liver disease or tumor Problems with blood clots or take blood-thinners Seizures Use intravenous drugs Uterus of unusual shape Vaginal bleeding that has not been explained An unusual or allergic reaction to levonorgestrel, other hormones, silicone, or polyethylene, medications, foods, dyes, or preservatives Pregnant or trying to get pregnant Breast-feeding How should I use this medication? This device is placed inside the uterus by your care team. A patient package insert for the product will be given each time it is inserted. Be sure to read this information carefully each time. The sheet maychange often. Talk to your care team about use of this medication in children. Special caremay be needed. Overdosage: If you think you have taken too much of this medicine contact apoison control center or emergency room at once. NOTE: This medicine is only for you. Do not share this medicine with others. What if I miss a dose? This does not apply. Depending on the brand of device you have inserted, the device will need to be replaced every 3 to 7 years if  you wish to continueusing this type of birth control. What may interact with this medication? Interactions are not expected. Tell your care team about all the medicationsyou take. This list may not describe all possible interactions. Give your health care provider a list of all the medicines, herbs, non-prescription drugs, or dietary supplements you use. Also tell them if you smoke, drink alcohol, or use illegaldrugs. Some items may interact with your medicine. What should I watch for while using this medication? Visit your care team for regular check-ups. Tell your care team if you or yourpartner becomes HIV positive or gets a sexually transmitted disease. This product does not protect you against HIV infection (AIDS) or othersexually transmitted diseases. You can check the placement of the IUD yourself by reaching up to the top of your vagina with clean fingers to feel the threads. Do not pull on the threads. It is a good habit to check placement after each menstrual period. Call your care team right away if you feel more of the IUD than just the threads or ifyou cannot feel the threads at all. The IUD may come out by itself. You may become pregnant if the device comes out. If you notice that the IUD has come out use a backup birth control methodlike condoms and call your care team. Using tampons will not change the position of the IUD and are okay to useduring your period. This IUD can be safely scanned with magnetic resonance imaging (MRI) only under specific conditions. Before you have an MRI, tell your care team that you havean IUD in place, and which type of IUD you have in place. What side  effects may I notice from receiving this medication? Side effects that you should report to your care team as soon as possible: Allergic reactions-skin rash, itching, hives, swelling of the face, lips, tongue, or throat Blood clot-pain, swelling, or warmth in the leg, shortness of breath, chest  pain Gallbladder problems-severe stomach pain, nausea, vomiting, fever Increase in blood pressure Liver injury-right upper belly pain, loss of appetite, nausea, light-colored stool, dark yellow or brown urine, yellowing skin or eyes, unusual weakness or fatigue New or worsening migraines or headaches Pelvic inflammatory disease (PID)-fever, abdominal pain, pelvic pain, pain or trouble passing urine, spotting, bleeding during or after sex Stroke-sudden numbness or weakness of the face, arm, or leg, trouble speaking, confusion, trouble walking, loss of balance or coordination, dizziness, severe headache, change in vision Unusual vaginal discharge, itching, or odor Vaginal pain, irritation, or sores Worsening mood, feelings of depression Side effects that usually do not require medical attention (report to your careteam if they continue or are bothersome): Breast pain or tenderness Dark patches of skin on the face or other sun-exposed areas Irregular menstrual cycles or spotting Nausea Weight gain This list may not describe all possible side effects. Call your doctor for medical advice about side effects. You may report side effects to FDA at1-800-FDA-1088. Where should I keep my medication? This does not apply. NOTE: This sheet is a summary. It may not cover all possible information. If you have questions about this medicine, talk to your doctor, pharmacist, orhealth care provider.  2022 Elsevier/Gold Standard (2020-08-26 16:46:12)    Preventive Care 28-76 Years Old, Female Preventive care refers to lifestyle choices and visits with your health care provider that can promote health and wellness. This includes: A yearly physical exam. This is also called an annual wellness visit. Regular dental and eye exams. Immunizations. Screening for certain conditions. Healthy lifestyle choices, such as: Eating a healthy diet. Getting regular exercise. Not using drugs or products that contain  nicotine and tobacco. Limiting alcohol use. What can I expect for my preventive care visit? Physical exam Your health care provider may check your: Height and weight. These may be used to calculate your BMI (body mass index). BMI is a measurement that tells if you are at a healthy weight. Heart rate and blood pressure. Body temperature. Skin for abnormal spots. Counseling Your health care provider may ask you questions about your: Past medical problems. Family's medical history. Alcohol, tobacco, and drug use. Emotional well-being. Home life and relationship well-being. Sexual activity. Diet, exercise, and sleep habits. Work and work Statistician. Access to firearms. Method of birth control. Menstrual cycle. Pregnancy history. What immunizations do I need?  Vaccines are usually given at various ages, according to a schedule. Your health care provider will recommend vaccines for you based on your age, medicalhistory, and lifestyle or other factors, such as travel or where you work. What tests do I need?  Blood tests Lipid and cholesterol levels. These may be checked every 5 years starting at age 52. Hepatitis C test. Hepatitis B test. Screening Diabetes screening. This is done by checking your blood sugar (glucose) after you have not eaten for a while (fasting). STD (sexually transmitted disease) testing, if you are at risk. BRCA-related cancer screening. This may be done if you have a family history of breast, ovarian, tubal, or peritoneal cancers. Pelvic exam and Pap test. This may be done every 3 years starting at age 7. Starting at age 44, this may be done every 5 years if you  have a Pap test in combination with an HPV test. Talk with your health care provider about your test results, treatment options,and if necessary, the need for more tests. Follow these instructions at home: Eating and drinking  Eat a healthy diet that includes fresh fruits and vegetables, whole  grains, lean protein, and low-fat dairy products. Take vitamin and mineral supplements as recommended by your health care provider. Do not drink alcohol if: Your health care provider tells you not to drink. You are pregnant, may be pregnant, or are planning to become pregnant. If you drink alcohol: Limit how much you have to 0-1 drink a day. Be aware of how much alcohol is in your drink. In the [place] one drink equals one 12 oz bottle of beer (355 mL), one 5 oz glass of wine (148 mL), or one 1 oz glass of hard liquor (44 mL).  Lifestyle Take daily care of your teeth and gums. Brush your teeth every morning and night with fluoride toothpaste. Floss one time each day. Stay active. Exercise for at least 30 minutes 5 or more days each week. Do not use any products that contain nicotine or tobacco, such as cigarettes, e-cigarettes, and chewing tobacco. If you need help quitting, ask your health care provider. Do not use drugs. If you are sexually active, practice safe sex. Use a condom or other form of protection to prevent STIs (sexually transmitted infections). If you do not wish to become pregnant, use a form of birth control. If you plan to become pregnant, see your health care provider for a prepregnancy visit. Find healthy ways to cope with stress, such as: Meditation, yoga, or listening to music. Journaling. Talking to a trusted person. Spending time with friends and family. Safety Always wear your seat belt while driving or riding in a vehicle. Do not drive: If you have been drinking alcohol. Do not ride with someone who has been drinking. When you are tired or distracted. While texting. Wear a helmet and other protective equipment during sports activities. If you have firearms in your house, make sure you follow all gun safety procedures. Seek help if you have been physically or sexually abused. What's next? Go to your health care provider once a year for an annual wellness  visit. Ask your health care provider how often you should have your eyes and teeth checked. Stay up to date on all vaccines. This information is not intended to replace advice given to you by your health care provider. Make sure you discuss any questions you have with your healthcare provider. Document Revised: 03/17/2020 Document Reviewed: 03/31/2018 Elsevier Patient Education  2022 [employer].

## 2021-01-10 NOTE — Progress Notes (Signed)
ANNUAL PREVENTATIVE CARE GYN  ENCOUNTER NOTE  Subjective:       Sierra Clay is a 30 y.o. [phone] female here for a routine annual gynecologic exam and STD testing.   Denies difficulty breathing or respiratory distress, chest pain, abdominal pain, vaginal bleeding, dysuria, and leg pain or swelling.   Following up with specialist regarding goiter and obesity.    Gynecologic History  No LMP recorded (lmp unknown). (Menstrual status: IUD).  Contraception: IUD, Mirena inserted 01/2018  Last Pap: 11/2019. Results were: abnormal, ASC-US/HPV negative   Obstetric History  OB History  Gravida Para Term Preterm AB Living  3 2 2   1 2   SAB IAB Ectopic Multiple Live Births  1     0 2    # Outcome Date GA Lbr Len/2nd Weight Sex Delivery Anes PTL Lv  3 Term 12/26/17 [redacted]w[redacted]d / 01:53 7 lb 4.4 oz (3.3 kg) M Vag-Vacuum EPI  LIV  2 Term 01/05/10   7 lb 2 oz (3.232 kg) F Vag-Spont  N LIV  1 SAB             Past Medical History:  Diagnosis Date   Asthma    Mild   History of abnormal cervical Pap smear 2012, 2013, 2016   Hypercholesteremia     Past Surgical History:  Procedure Laterality Date   NO PAST SURGERIES      Current Outpatient Medications on File Prior to Visit  Medication Sig Dispense Refill   phentermine (ADIPEX-P) 37.5 MG tablet Take 1 tablet by mouth daily.     triamcinolone (KENALOG) 0.1 % Apply 1 application topically 2 (two) times daily as needed. Mixed with lotion 45 g 1   Vitamin D, Ergocalciferol, (DRISDOL) 1.25 MG (50000 UNIT) CAPS capsule Take 1 capsule (50,000 Units total) by mouth every 7 (seven) days. 12 capsule 4   hydrocortisone (ANUSOL-HC) 25 MG suppository Place 1 suppository (25 mg total) rectally 2 (two) times daily. (Patient not taking: Reported on 01/10/2021) 12 suppository 0   lubiprostone (AMITIZA) 24 MCG capsule Take 1 capsule (24 mcg total) by mouth 2 (two) times daily with a meal. (Patient not taking: Reported on 01/10/2021) 60 capsule 2    NIFEdipine Micronized POWD Place 1 each rectally daily. (Patient not taking: Reported on 01/10/2021) 5 g 0   Current Facility-Administered Medications on File Prior to Visit  Medication Dose Route Frequency Provider Last Rate Last Admin   levonorgestrel (MIRENA) 20 MCG/24HR IUD   Intrauterine Once Schuman, 03/12/2021, MD        No Known Allergies  Social History   Socioeconomic History   Marital status: Domestic Partner    Spouse name: Jaquelyn Bitter   Number of children: 2   Years of education: Not on file   Highest education level: Bachelor's degree (e.g., BA, AB, BS)  Occupational History   Occupation: [employer]     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   Lives with the father of her youngest child ( boy )  also has an older girl    Social Determinants of Corporate investment banker Strain: Not on file  Food Insecurity: Not on file  Transportation Needs: Not on file  Physical Activity: Not on file  Stress: Not on file  Social Connections: Not on file  Intimate Partner Violence: Not on file    Family History  Problem Relation Age of Onset   Diabetes Paternal Grandmother    Cancer Maternal Grandmother    Hypercholesterolemia Mother    Hypercholesterolemia Father    Diabetes Brother     The following portions of the patient's history were reviewed and updated as appropriate: allergies, current medications, past family history, past medical history, past social history, past surgical history and problem list.  Review of Systems  ROS negative except as noted above. Information  obtained from patient.    Objective:   BP 119/82   Pulse 86   Resp 16   Ht 5' 1.5" (1.562 m)   Wt 179 lb 9.6 oz (81.5 kg)   LMP  (LMP Unknown)   BMI 33.39 kg/m   CONSTITUTIONAL: Well-developed, well-nourished female in no acute distress.   PSYCHIATRIC: Normal mood and affect. Normal behavior. Normal judgment and thought content.  NEUROLGIC: Alert and oriented to person, place, and time. Normal muscle tone coordination. No cranial nerve deficit noted.  HENT:  Normocephalic, atraumatic.  EYES: Conjunctivae and EOM are normal.  SKIN: Skin is warm and dry. No rash noted. Not diaphoretic. No erythema. No pallor.  CARDIOVASCULAR: Normal heart rate noted,  regular rhythm, no murmur.  RESPIRATORY: Clear to auscultation bilaterally. Effort and breath sounds normal, no problems with respiration noted.  BREASTS: Symmetric in size. No masses, skin changes, nipple drainage, or lymphadenopathy.  ABDOMEN: Soft, normal bowel sounds, no distention noted.  No tenderness, rebound or guarding.   PELVIC:  External Genitalia: Normal  Vagina: Normal  Cervix: Normal, IUD string present, swab collected  Uterus: Normal  Adnexa: Normal   MUSCULOSKELETAL: Normal range of motion. No tenderness.  No cyanosis, clubbing, or edema.  2+ distal pulses.  LYMPHATIC: No Axillary, Supraclavicular, or Inguinal Adenopathy.  Assessment:   Annual gynecologic examination 30 y.o.  Contraception: IUD, Mirena inserted 01/2018  Obesity 1  Problem List Items Addressed This Visit       Other   Atypical squamous cell changes of undetermined significance (ASCUS) on cervical cytology with negative high risk human papilloma virus (HPV) test result   Other Visit Diagnoses     Well woman exam    -  Primary   Relevant Orders   Cervicovaginal ancillary only   IUD (intrauterine device) in place       Relevant Orders   Cervicovaginal ancillary only   Screen for STD (sexually transmitted disease)        Relevant Orders   Cervicovaginal ancillary only       Plan:   Pap: Not needed, next 11/2022  Labs: See orders  Routine preventative health maintenance measures emphasized: Exercise/Diet/Weight control, Tobacco Warnings, Alcohol/Substance use risks, and Stress Management; see AVS  Reviewed red flag symptoms and when to call  Return to Clinic - 1 Year for [facility] or sooner if needed   Serafina Royals, CNM  [facility], [facility] 01/10/21 3:06 PM

## 2021-01-14 LAB — CERVICOVAGINAL ANCILLARY ONLY
Bacterial Vaginitis (gardnerella): POSITIVE — AB
Candida Glabrata: NEGATIVE
Candida Vaginitis: NEGATIVE
Chlamydia: NEGATIVE
Comment: NEGATIVE
Comment: NEGATIVE
Comment: NEGATIVE
Comment: NEGATIVE
Comment: NEGATIVE
Comment: NORMAL
Neisseria Gonorrhea: NEGATIVE
Trichomonas: NEGATIVE

## 2021-01-16 ENCOUNTER — Other Ambulatory Visit: Payer: Self-pay | Admitting: Certified Nurse Midwife

## 2021-01-16 DIAGNOSIS — B9689 Other specified bacterial agents as the cause of diseases classified elsewhere: Secondary | ICD-10-CM

## 2021-01-16 DIAGNOSIS — N760 Acute vaginitis: Secondary | ICD-10-CM

## 2021-01-16 MED ORDER — METRONIDAZOLE 500 MG PO TABS: 500.0000 mg | ORAL_TABLET | Freq: Two times a day (BID) | ORAL | 0 refills | Status: AC

## 2021-01-16 MED ORDER — FLUCONAZOLE 150 MG PO TABS: 150.0000 mg | ORAL_TABLET | Freq: Every day | ORAL | 1 refills | Status: DC

## 2021-01-16 NOTE — Progress Notes (Signed)
Rx Flagyl, see orders.    Serafina Royals, CNM [facility], [facility] 01/16/21 11:52 AM

## 2021-02-01 IMAGING — CT CT HEAD W/O CM
3 series · 15 of 47 positions shown, 18 images · non-contrast
Comparison: December 25, 2007

CLINICAL DATA: Pain after fall yesterday.

EXAM:
CT HEAD WITHOUT CONTRAST
CT CERVICAL SPINE WITHOUT CONTRAST
TECHNIQUE: Multidetector CT imaging of the head and cervical spine was
performed following the standard protocol without intravenous
contrast. Multiplanar CT image reconstructions of the cervical spine
were also generated.

[Series 2: head w o · axial · 0.48mm/px · z∈[+7,+132]mm · 9 of 30 slices shown, 12 images]
[im 3/30  brain]
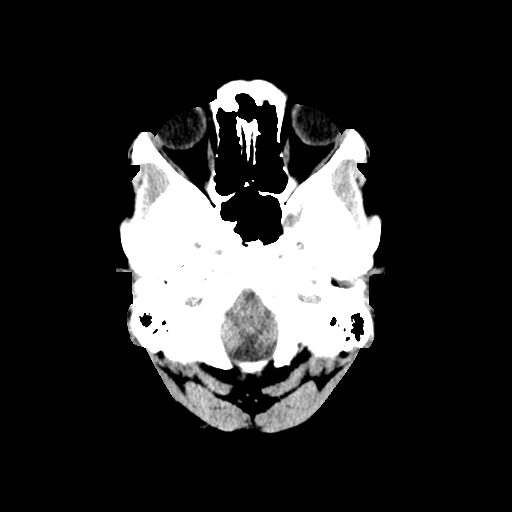
[im 3/30  bone]
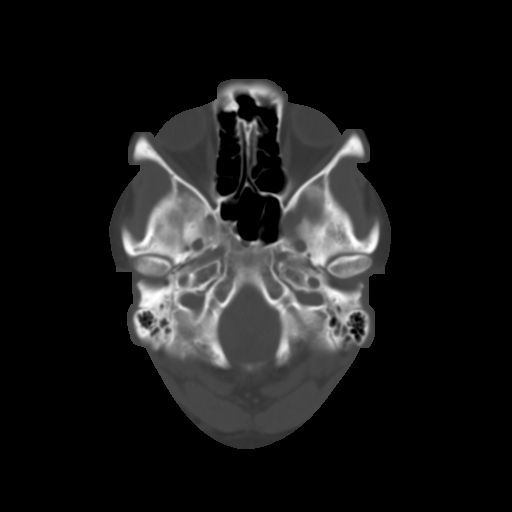
[im 6/30  brain]
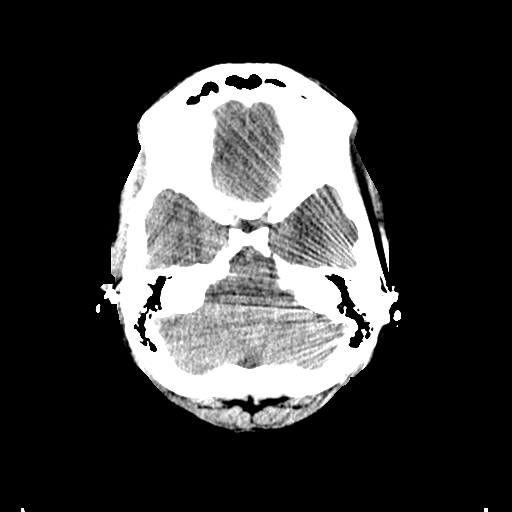
[im 9/30  brain]
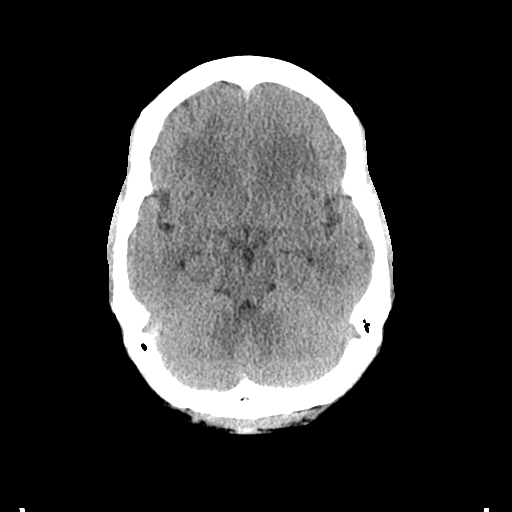
[im 12/30  brain]
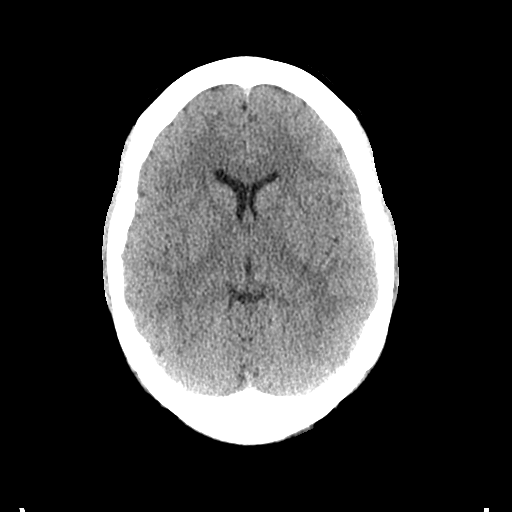
[im 16/30  brain]
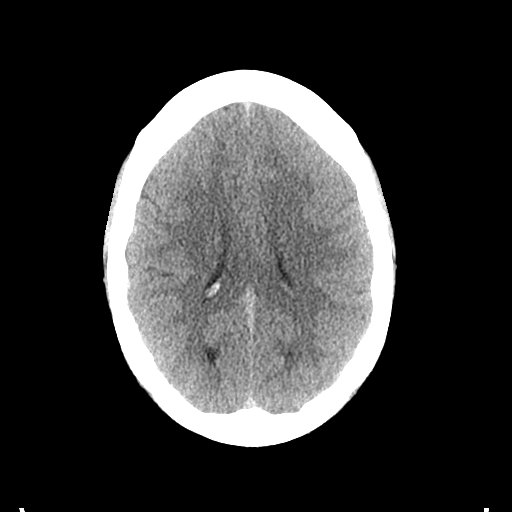
[im 16/30  bone]
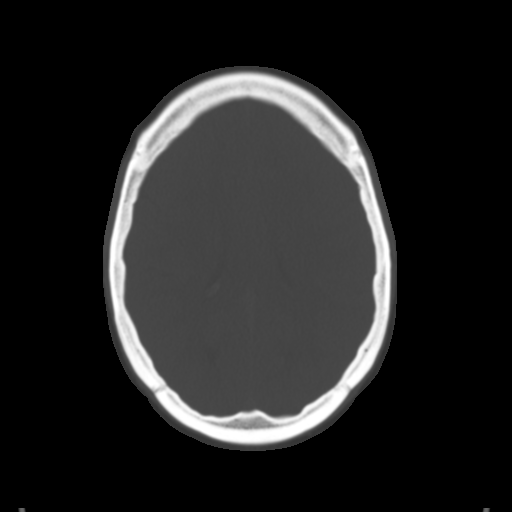
[im 19/30  brain]
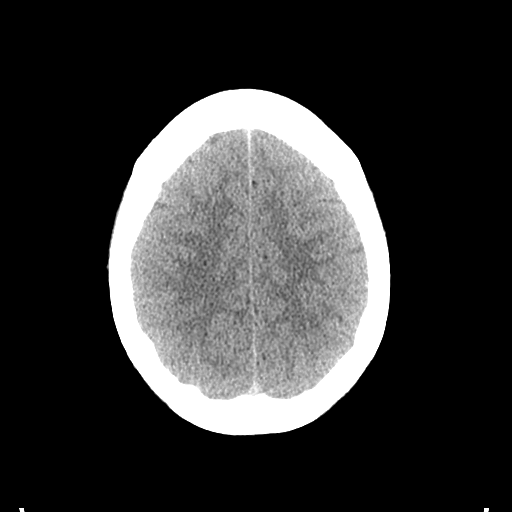
[im 22/30  brain]
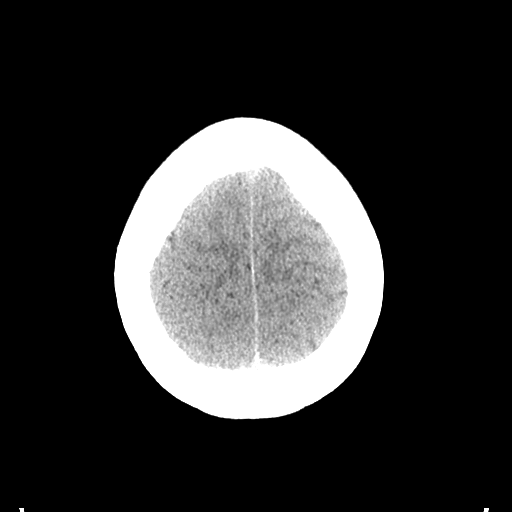
[im 25/30  brain]
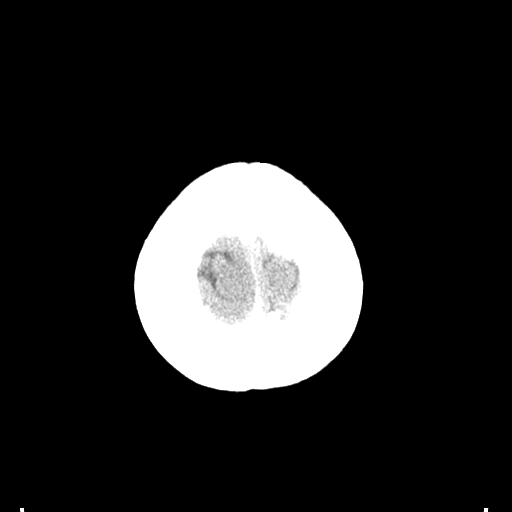
[im 28/30  brain]
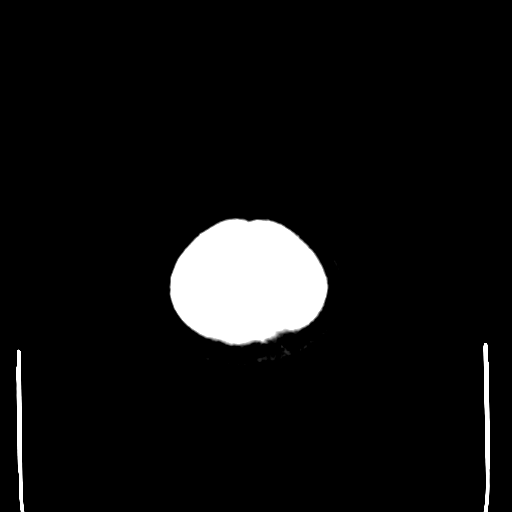
[im 28/30  bone]
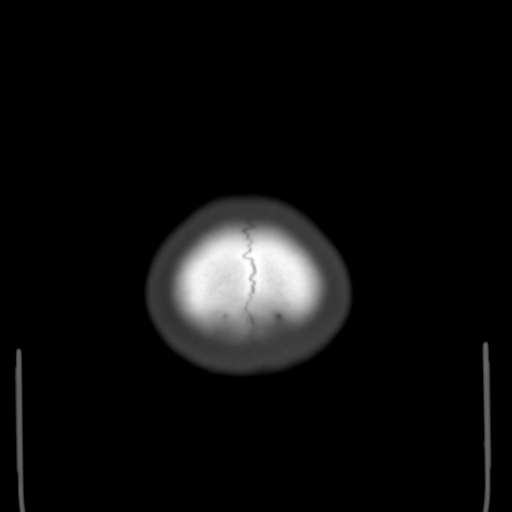

[Series 4: coronal soft · coronal · 0.35mm/px · 3 of 68 slices shown]
[im 23/68  brain]
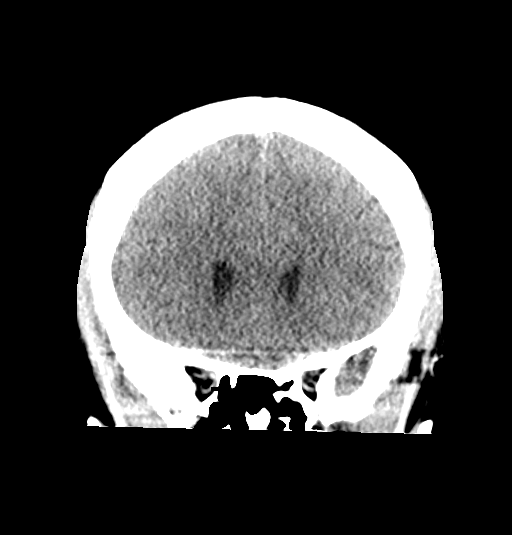
[im 30/68  brain]
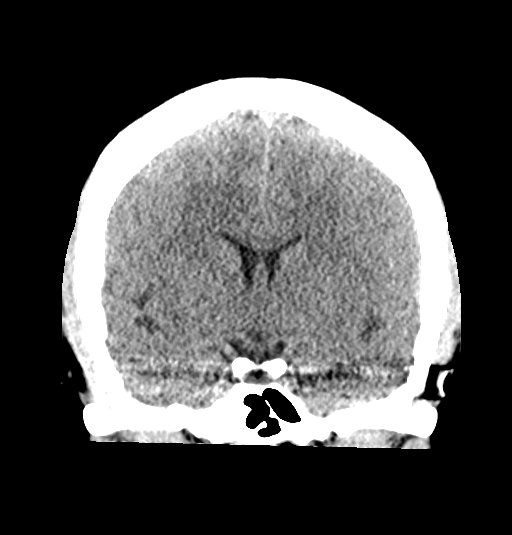
[im 38/68  brain]
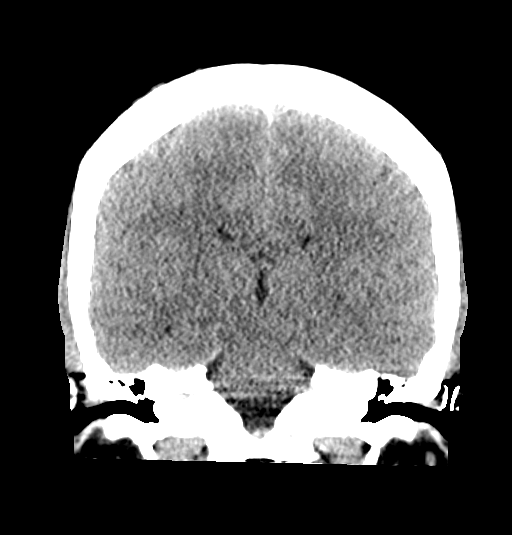

[Series 5: sagittal soft · sagittal · 0.34mm/px · 3 of 56 slices shown]
[im 19/56  brain]
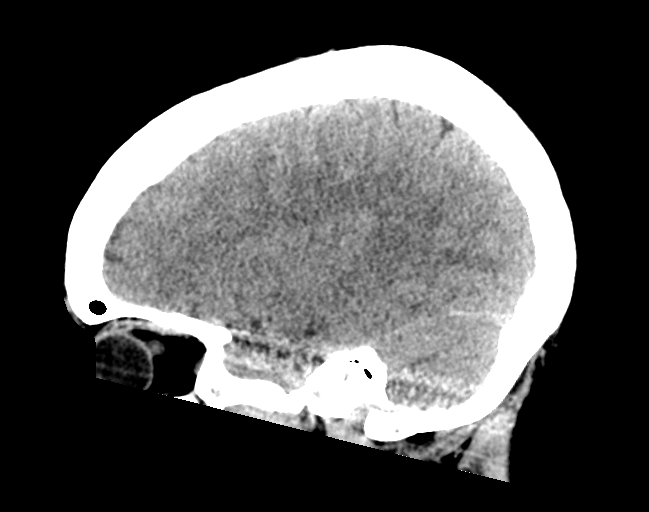
[im 28/56  brain]
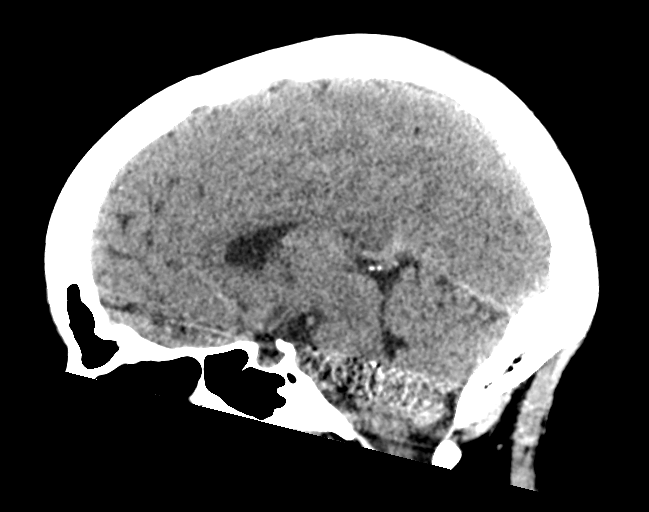
[im 37/56  brain]
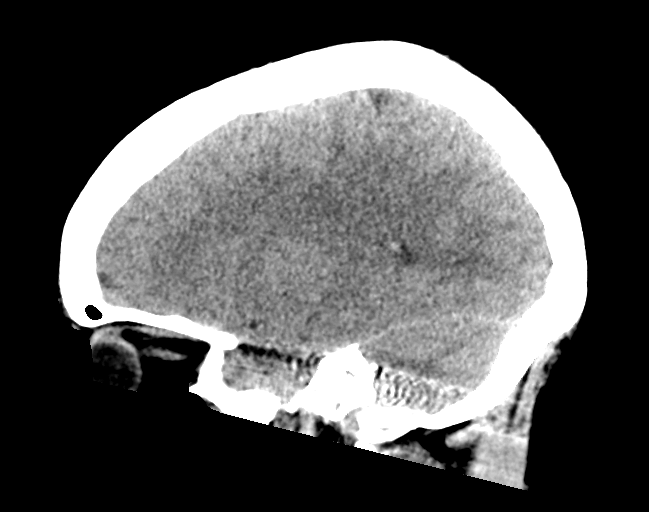

[15 of 47 positions shown; findings below may reference images not displayed]

FINDINGS: CT HEAD FINDINGS

Brain: No evidence of acute infarction, hemorrhage, hydrocephalus,
extra-axial collection or mass lesion/mass effect.

Vascular: No hyperdense vessel or unexpected calcification.

Skull: Normal. Negative for fracture or focal lesion.

Sinuses/Orbits: No acute finding.

Other: No other abnormalities are identified.

CT CERVICAL SPINE FINDINGS

Alignment: There is reversal of normal lordosis. No other
malalignment.

Skull base and vertebrae: No acute fracture. No primary bone lesion
or focal pathologic process.

Soft tissues and spinal canal: No prevertebral fluid or swelling. No
visible canal hematoma.

Disc levels:  No significant degenerative changes.

Upper chest: Negative.

Other: No other abnormalities.
IMPRESSION: 1. No acute intracranial abnormalities.
2. No fracture or traumatic malalignment in the cervical spine.

## 2021-02-01 IMAGING — DX DG LUMBAR SPINE COMPLETE 4+V
5 series · 5 of 5 positions shown · non-contrast
Comparison: None.

CLINICAL DATA: Pain following fall

EXAM:
LUMBAR SPINE - COMPLETE 4+ VIEW

[l-spine ap]
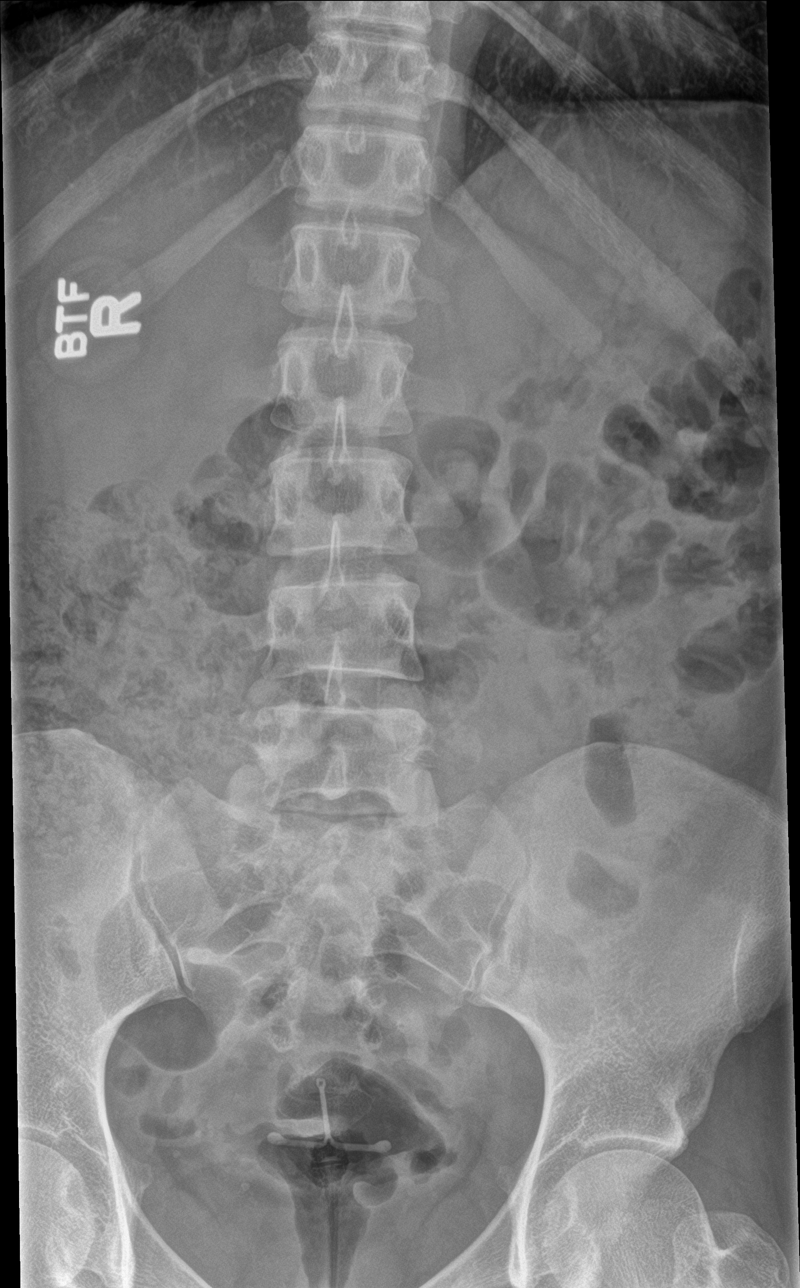

[l-spine obl (1 of 2)]
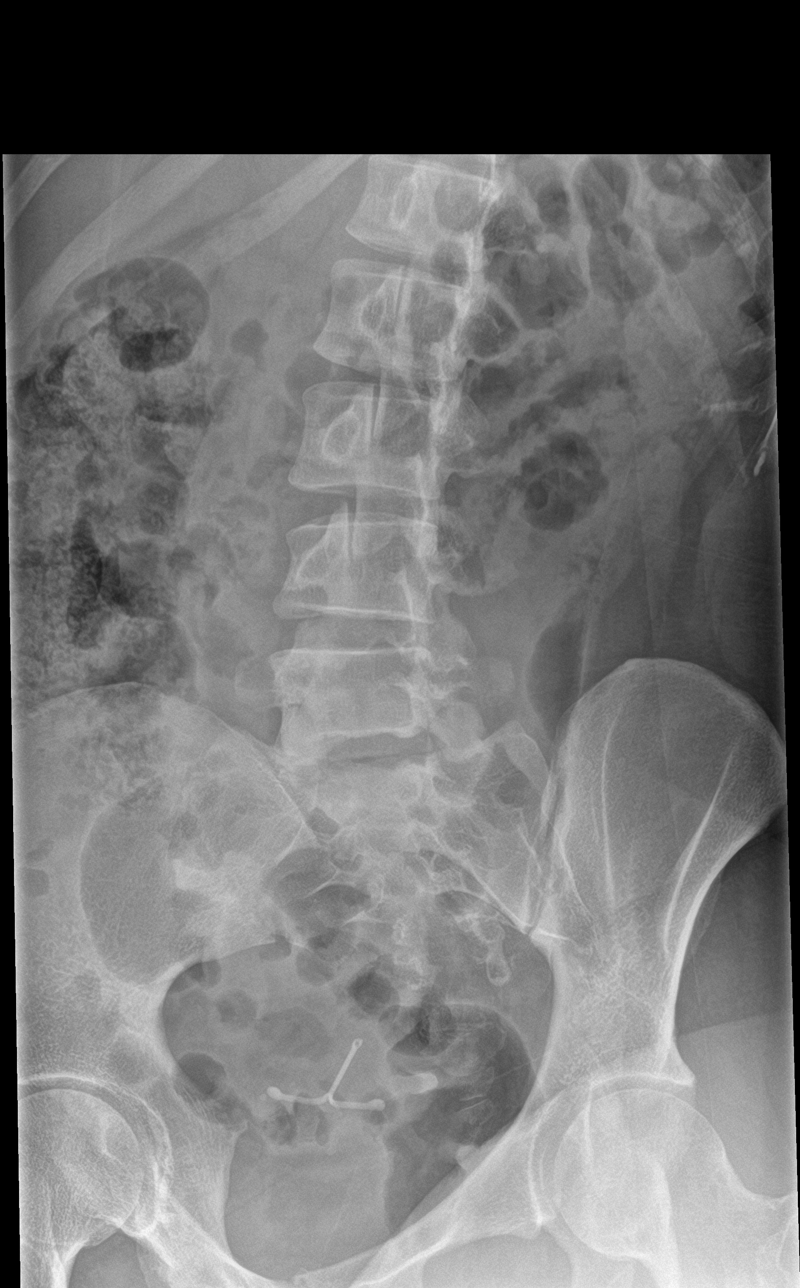

[l-spine obl (2 of 2)]
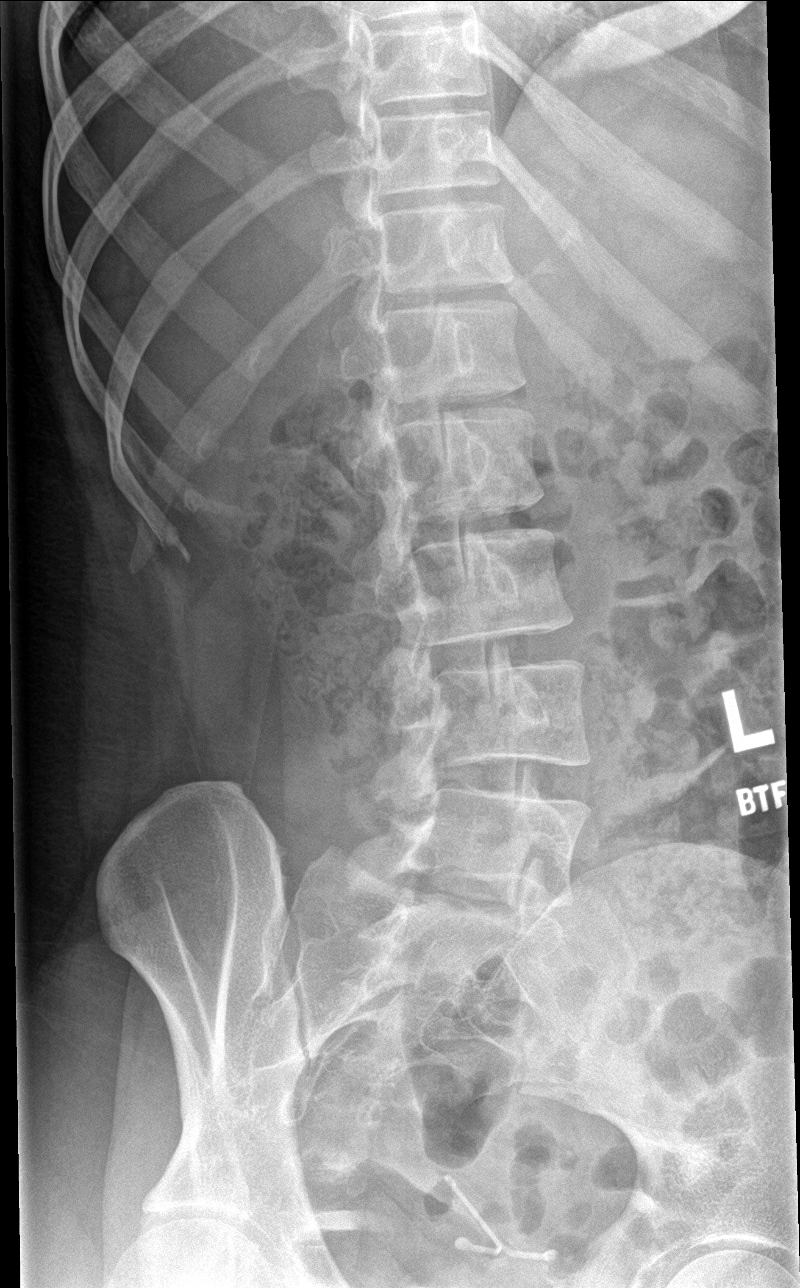

[l-spine lat]
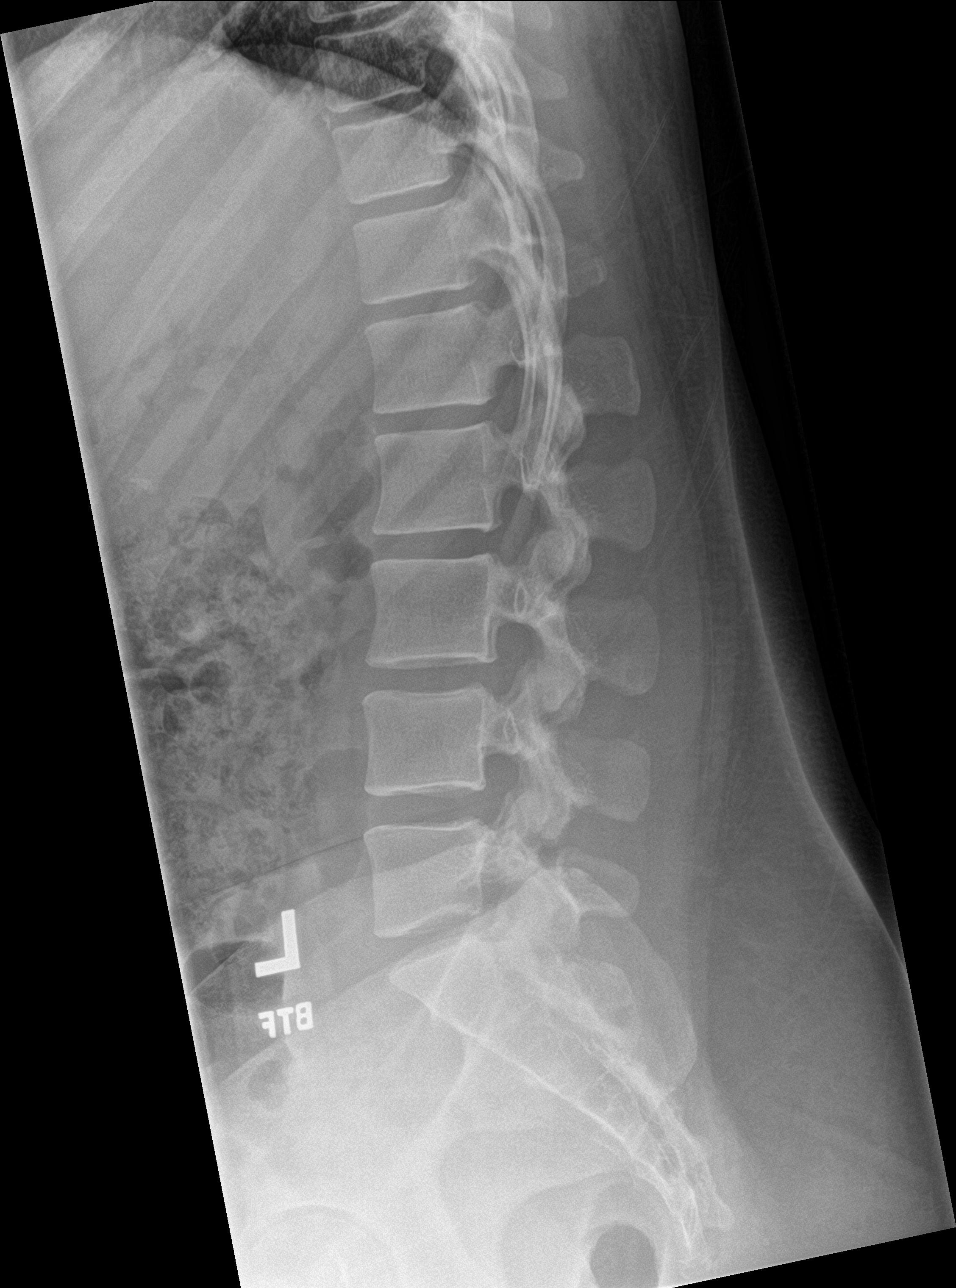

[l-spine spot]
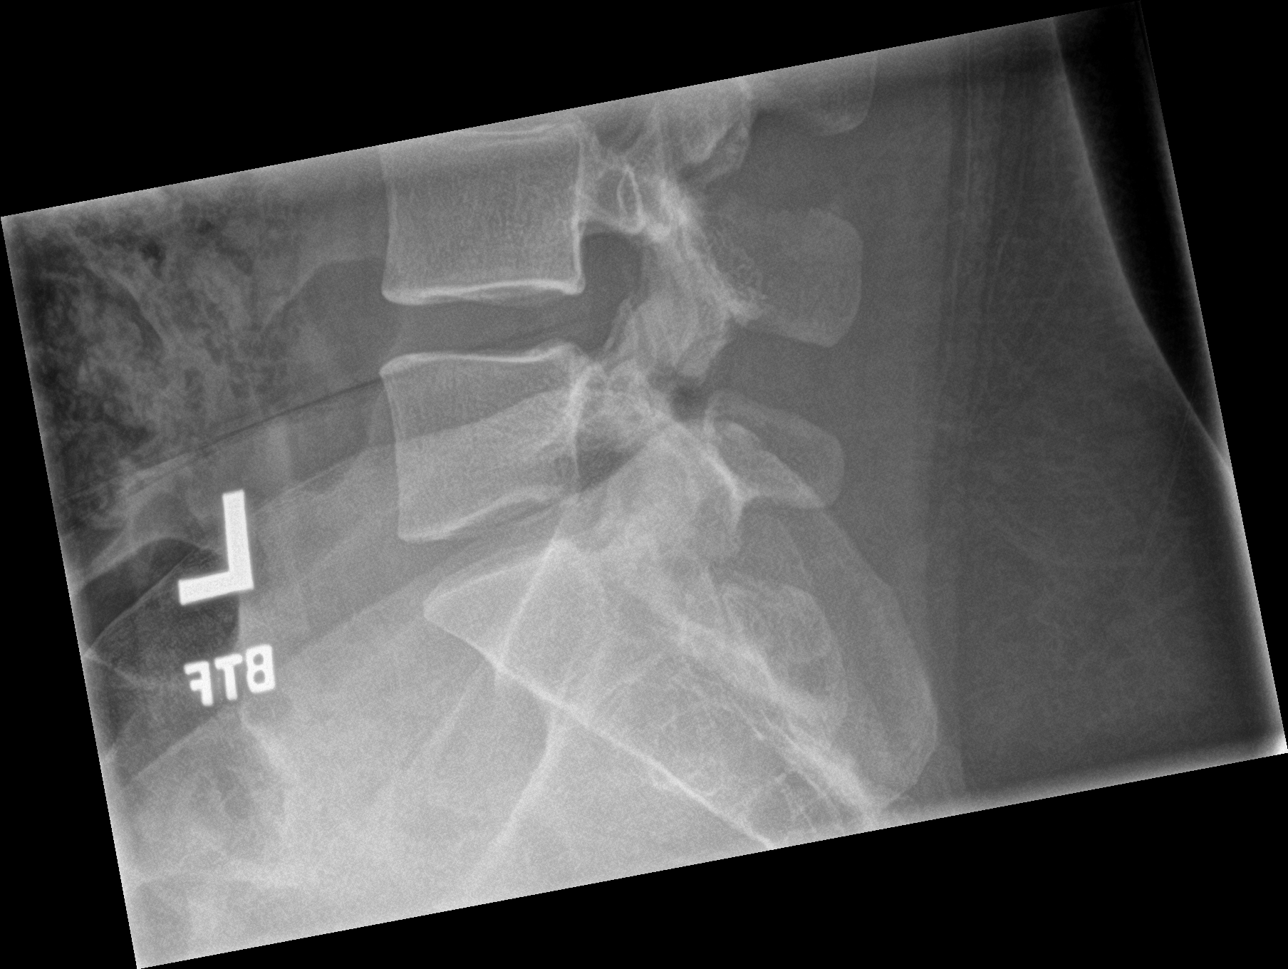

[5 of 5 positions shown; findings below may reference images not displayed]

FINDINGS: Frontal, lateral, spot lumbosacral lateral, and bilateral oblique
views were obtained. There are 5 non-rib-bearing lumbar type
vertebral bodies. There is slight upper lumbar dextroscoliosis.
There is no fracture or spondylolisthesis. The disc spaces appear
normal. There is no appreciable facet arthropathy.

There is an intrauterine device in the mid-pelvis.
IMPRESSION: Slight scoliosis. No fracture or spondylolisthesis. No appreciable
arthropathy. Intrauterine device in mid pelvis.

## 2021-07-08 ENCOUNTER — Telehealth (INDEPENDENT_AMBULATORY_CARE_PROVIDER_SITE_OTHER): Payer: BC Managed Care – PPO | Admitting: Nurse Practitioner

## 2021-07-08 ENCOUNTER — Encounter: Payer: Self-pay | Admitting: Nurse Practitioner

## 2021-07-08 ENCOUNTER — Other Ambulatory Visit: Payer: Self-pay

## 2021-07-08 DIAGNOSIS — N898 Other specified noninflammatory disorders of vagina: Secondary | ICD-10-CM | POA: Diagnosis not present

## 2021-07-08 NOTE — Progress Notes (Signed)
Name: Sierra Clay   MRN: [id number]    DOB: 05/15/1991   Date:07/08/2021       Progress Note  Subjective  Chief Complaint  Chief Complaint  Patient presents with   Vaginal Discharge    White discharge and chunky for 2 weeks    I connected with  Ihor Austin  on 07/08/21 at  3:00 PM EST by a video enabled telemedicine application and verified that I am speaking with the correct person using two identifiers.  I discussed the limitations of evaluation and management by telemedicine and the availability of in person appointments. The patient expressed understanding and agreed to proceed with a virtual visit  Staff also discussed with the patient that there may be a patient responsible charge related to this service. Patient Location: home Provider Location: [facility] Additional Individuals present: alone  HPI  Vaginal discharge: She says that she has had a little vaginal discharge for two weeks, but this morning she had a big clump of discharge and it made her nervous.  She is going to come by for a vaginal swab.  She denies any new sexual partner.  She denies any urinary frequency, urgency or dysuria.  She denies any pain or vaginal bleeding.  Will get swab and treat accordingly.   Patient Active Problem List   Diagnosis Date Noted   Atypical squamous cell changes of undetermined significance (ASCUS) on cervical cytology with negative high risk human papilloma virus (HPV) test result 01/10/2021   History of asthma 10/24/2018   Pure hypercholesterolemia 10/24/2018   Obesity (BMI 30.0-34.9) 10/24/2018   Vitamin D deficiency 10/24/2018    Social History   Tobacco Use   Smoking status: Never   Smokeless tobacco: Never  Substance Use Topics   Alcohol use: No    Current Outpatient Medications:    lubiprostone (AMITIZA) 24 MCG capsule, Take 1 capsule (24 mcg total) by mouth 2 (two) times daily with a meal., Disp: 60 capsule, Rfl: 2   fluconazole (DIFLUCAN) 150 MG tablet, Take 1  tablet (150 mg total) by mouth daily. (Patient not taking: Reported on 07/08/2021), Disp: 1 tablet, Rfl: 1   hydrocortisone (ANUSOL-HC) 25 MG suppository, Place 1 suppository (25 mg total) rectally 2 (two) times daily. (Patient not taking: Reported on 01/10/2021), Disp: 12 suppository, Rfl: 0   NIFEdipine Micronized POWD, Place 1 each rectally daily. (Patient not taking: Reported on 01/10/2021), Disp: 5 g, Rfl: 0   phentermine (ADIPEX-P) 37.5 MG tablet, Take 1 tablet by mouth daily. (Patient not taking: Reported on 07/08/2021), Disp: , Rfl:    triamcinolone (KENALOG) 0.1 %, Apply 1 application topically 2 (two) times daily as needed. Mixed with lotion (Patient not taking: Reported on 07/08/2021), Disp: 45 g, Rfl: 1   Vitamin D, Ergocalciferol, (DRISDOL) 1.25 MG (50000 UNIT) CAPS capsule, Take 1 capsule (50,000 Units total) by mouth every 7 (seven) days. (Patient not taking: Reported on 07/08/2021), Disp: 12 capsule, Rfl: 4  Current Facility-Administered Medications:    levonorgestrel (MIRENA) 20 MCG/24HR IUD, , Intrauterine, Once, Schuman, Christanna R, MD  No Known Allergies  I personally reviewed active problem list, medication list, allergies with the patient/caregiver today.  ROS  Constitutional: Negative for fever or weight change.  Respiratory: Negative for cough and shortness of breath.   Cardiovascular: Negative for chest pain or palpitations.  Gastrointestinal: Negative for abdominal pain, no bowel changes.  Musculoskeletal: Negative for gait problem or joint swelling.  Skin: Negative for rash.  Neurological: Negative for  dizziness or headache.  No other specific complaints in a complete review of systems (except as listed in HPI above).   Objective  Virtual encounter, vitals not obtained.  There is no height or weight on file to calculate BMI.  Nursing Note and Vital Signs reviewed.  Physical Exam  Awake, alert and oriented  No results found for this or any previous visit  (from the past 72 hour(s)).  Assessment & Plan  1. Vaginal discharge -she will come by to do swab and then we will treat accordingly - Cervicovaginal ancillary only   -Red flags and when to present for emergency care or RTC including fever >101.26F, chest pain, shortness of breath, new/worsening/un-resolving symptoms, reviewed with patient at time of visit. Follow up and care instructions discussed and provided in AVS. - I discussed the assessment and treatment plan with the patient. The patient was provided an opportunity to ask questions and all were answered. The patient agreed with the plan and demonstrated an understanding of the instructions.  I provided 15 minutes of non-face-to-face time during this encounter.  Berniece Salines, FNP

## 2021-07-09 ENCOUNTER — Telehealth: Payer: BC Managed Care – PPO | Admitting: Nurse Practitioner

## 2021-07-21 ENCOUNTER — Telehealth: Payer: Self-pay

## 2021-07-21 NOTE — Telephone Encounter (Signed)
Lvm for pt to call us back.

## 2021-07-21 NOTE — Telephone Encounter (Signed)
Copied from CRM [id number]. Topic: General - Other >> Jul 18, 2021  4:40 PM Herby Abraham C wrote: Reason for CRM: [phone] -- pt called in for assistance. Pt says that she was told to come in to complete a self vaginal swap. Pt would like to know if this has to be scheduled or could she just walk in ?   Please assist pt further.

## 2022-01-13 ENCOUNTER — Ambulatory Visit (INDEPENDENT_AMBULATORY_CARE_PROVIDER_SITE_OTHER): Payer: BC Managed Care – PPO | Admitting: Obstetrics and Gynecology

## 2022-01-13 ENCOUNTER — Encounter: Payer: Self-pay | Admitting: Obstetrics and Gynecology

## 2022-01-13 VITALS — BP 126/80 | HR 97 | Resp 16 | Ht 62.0 in | Wt 181.9 lb

## 2022-01-13 DIAGNOSIS — T8332XA Displacement of intrauterine contraceptive device, initial encounter: Secondary | ICD-10-CM | POA: Diagnosis not present

## 2022-01-13 DIAGNOSIS — E559 Vitamin D deficiency, unspecified: Secondary | ICD-10-CM

## 2022-01-13 DIAGNOSIS — E7800 Pure hypercholesterolemia, unspecified: Secondary | ICD-10-CM

## 2022-01-13 DIAGNOSIS — Z01419 Encounter for gynecological examination (general) (routine) without abnormal findings: Secondary | ICD-10-CM | POA: Diagnosis not present

## 2022-01-13 DIAGNOSIS — E669 Obesity, unspecified: Secondary | ICD-10-CM

## 2022-01-13 NOTE — Patient Instructions (Signed)
Breast Self-Awareness Breast self-awareness is knowing how your breasts look and feel. You need to: Check your breasts on a regular basis. Tell your doctor about any changes. Become familiar with the look and feel of your breasts. This can help you catch a breast problem while it is still small and can be treated. You should do breast self-exams even if you have breast implants. What you need: A mirror. A well-lit room. A pillow or other soft object. How to do a breast self-exam Follow these steps to do a breast self-exam: Look for changes  Take off all the clothes above your waist. Stand in front of a mirror in a room with good lighting. Put your hands down at your sides. Compare your breasts in the mirror. Look for any difference between them, such as: A difference in shape. A difference in size. Wrinkles, dips, and bumps in one breast and not the other. Look at each breast for changes in the skin, such as: Redness. Scaly areas. Skin that has gotten thicker. Dimpling. Open sores (ulcers). Look for changes in your nipples, such as: Fluid coming out of a nipple. Fluid around a nipple. Bleeding. Dimpling. Redness. A nipple that looks pushed in (retracted), or that has changed position. Feel for changes Lie on your back. Feel each breast. To do this: Pick a breast to feel. Place a pillow under the shoulder closest to that breast. Put the arm closest to that breast behind your head. Feel the nipple area of that breast using the hand of your other arm. Feel the area with the pads of your three middle fingers by making small circles with your fingers. Use light, medium, and firm pressure. Continue the overlapping circles, moving downward over the breast. Keep making circles with your fingers. Stop when you feel your ribs. Start making circles with your fingers again, this time going upward until you reach your collarbone. Then, make circles outward across your breast and into your  armpit area. Squeeze your nipple. Check for discharge and lumps. Repeat these steps to check your other breast. Sit or stand in the tub or shower. With soapy water on your skin, feel each breast the same way you did when you were lying down. Write down what you find Writing down what you find can help you remember what to tell your doctor. Write down: What is normal for each breast. Any changes you find in each breast. These include: The kind of changes you find. A tender or painful breast. Any lump you find. Write down its size and where it is. When you last had your monthly period (menstrual cycle). General tips If you are breastfeeding, the best time to check your breasts is after you feed your baby or after you use a breast pump. If you get monthly bleeding, the best time to check your breasts is 5-7 days after your monthly cycle ends. With time, you will become comfortable with the self-exam. You will also start to know if there are changes in your breasts. Contact a doctor if: You see a change in the shape or size of your breasts or nipples. You see a change in the skin of your breast or nipples, such as red or scaly skin. You have fluid coming from your nipples that is not normal. You find a new lump or thick area. You have breast pain. You have any concerns about your breast health. Summary Breast self-awareness includes looking for changes in your breasts and feeling for changes   within your breasts. You should do breast self-awareness in front of a mirror in a well-lit room. If you get monthly periods (menstrual cycles), the best time to check your breasts is 5-7 days after your period ends. Tell your doctor about any changes you see in your breasts. Changes include changes in size, changes on the skin, painful or tender breasts, or fluid from your nipples that is not normal. This information is not intended to replace advice given to you by your health care provider. Make sure  you discuss any questions you have with your health care provider. Document Revised: 05/22/2021 Document Reviewed: 05/22/2021 Elsevier Patient Education  2023 Elsevier Inc. Preventive Care 21-39 Years Old, Female Preventive care refers to lifestyle choices and visits with your health care provider that can promote health and wellness. Preventive care visits are also called wellness exams. What can I expect for my preventive care visit? Counseling During your preventive care visit, your health care provider may ask about your: Medical history, including: Past medical problems. Family medical history. Pregnancy history. Current health, including: Menstrual cycle. Method of birth control. Emotional well-being. Home life and relationship well-being. Sexual activity and sexual health. Lifestyle, including: Alcohol, nicotine or tobacco, and drug use. Access to firearms. Diet, exercise, and sleep habits. Work and work environment. Sunscreen use. Safety issues such as seatbelt and bike helmet use. Physical exam Your health care provider may check your: Height and weight. These may be used to calculate your BMI (body mass index). BMI is a measurement that tells if you are at a healthy weight. Waist circumference. This measures the distance around your waistline. This measurement also tells if you are at a healthy weight and may help predict your risk of certain diseases, such as type 2 diabetes and high blood pressure. Heart rate and blood pressure. Body temperature. Skin for abnormal spots. What immunizations do I need?  Vaccines are usually given at various ages, according to a schedule. Your health care provider will recommend vaccines for you based on your age, medical history, and lifestyle or other factors, such as travel or where you work. What tests do I need? Screening Your health care provider may recommend screening tests for certain conditions. This may include: Pelvic exam  and Pap test. Lipid and cholesterol levels. Diabetes screening. This is done by checking your blood sugar (glucose) after you have not eaten for a while (fasting). Hepatitis B test. Hepatitis C test. HIV (human immunodeficiency virus) test. STI (sexually transmitted infection) testing, if you are at risk. BRCA-related cancer screening. This may be done if you have a family history of breast, ovarian, tubal, or peritoneal cancers. Talk with your health care provider about your test results, treatment options, and if necessary, the need for more tests. Follow these instructions at home: Eating and drinking  Eat a healthy diet that includes fresh fruits and vegetables, whole grains, lean protein, and low-fat dairy products. Take vitamin and mineral supplements as recommended by your health care provider. Do not drink alcohol if: Your health care provider tells you not to drink. You are pregnant, may be pregnant, or are planning to become pregnant. If you drink alcohol: Limit how much you have to 0-1 drink a day. Know how much alcohol is in your drink. In the [place] one drink equals one 12 oz bottle of beer (355 mL), one 5 oz glass of wine (148 mL), or one 1 oz glass of hard liquor (44 mL). Lifestyle Brush your teeth every morning and   night with fluoride toothpaste. Floss one time each day. Exercise for at least 30 minutes 5 or more days each week. Do not use any products that contain nicotine or tobacco. These products include cigarettes, chewing tobacco, and vaping devices, such as e-cigarettes. If you need help quitting, ask your health care provider. Do not use drugs. If you are sexually active, practice safe sex. Use a condom or other form of protection to prevent STIs. If you do not wish to become pregnant, use a form of birth control. If you plan to become pregnant, see your health care provider for a prepregnancy visit. Find healthy ways to manage stress, such as: Meditation, yoga, or  listening to music. Journaling. Talking to a trusted person. Spending time with friends and family. Minimize exposure to UV radiation to reduce your risk of skin cancer. Safety Always wear your seat belt while driving or riding in a vehicle. Do not drive: If you have been drinking alcohol. Do not ride with someone who has been drinking. If you have been using any mind-altering substances or drugs. While texting. When you are tired or distracted. Wear a helmet and other protective equipment during sports activities. If you have firearms in your house, make sure you follow all gun safety procedures. Seek help if you have been physically or sexually abused. What's next? Go to your health care provider once a year for an annual wellness visit. Ask your health care provider how often you should have your eyes and teeth checked. Stay up to date on all vaccines. This information is not intended to replace advice given to you by your health care provider. Make sure you discuss any questions you have with your health care provider. Document Revised: 01/15/2021 Document Reviewed: 01/15/2021 Elsevier Patient Education  2023 Elsevier Inc.  

## 2022-01-13 NOTE — Progress Notes (Signed)
GYNECOLOGY ANNUAL PHYSICAL EXAM PROGRESS NOTE  Subjective:    Sierra Clay is a 31 y.o. [phone] female who presents for an annual exam. She was previously seen by Dani Gobble, CNM. The patient has no complaints today. The patient is sexually active. The patient participates in regular exercise: yes. Has the patient ever been transfused or tattooed?: yes. The patient reports that there is not domestic violence in her life.    Menstrual History: Menarche age: 24 No LMP recorded. (Menstrual status: IUD).     Gynecologic History:  Contraception: IUD History of STI's: Yes Last Pap: 11/16/2019. Results were: abnormal.  ASCUS HR HPV neg. Denies prior h/o abnormal pap smears.    Upstream - 01/13/22 1450       Pregnancy Intention Screening   Does the patient want to become pregnant in the next year? No    Does the patient's partner want to become pregnant in the next year? No    Would the patient like to discuss contraceptive options today? No      Contraception Wrap Up   Current Method IUD or IUS    End Method IUD or IUS    Contraception Counseling Provided No            The pregnancy intention screening data noted above was reviewed. Potential methods of contraception were discussed. The patient elected to proceed with IUD or IUS.    OB History  Gravida Para Term Preterm AB Living  3 2 2  0 1 2  SAB IAB Ectopic Multiple Live Births  1 0 0 0 2    # Outcome Date GA Lbr Len/2nd Weight Sex Delivery Anes PTL Lv  3 Term 12/26/17 [redacted]w[redacted]d / 01:53 7 lb 4.4 oz (3.3 kg) M Vag-Vacuum EPI  LIV     Name: Sierra Clay     Apgar1: 5  Apgar5: 6  2 Term 01/05/10   7 lb 2 oz (3.232 kg) F Vag-Spont  N LIV     Name: Sierra Clay  1 SAB             Past Medical History:  Diagnosis Date   Asthma    Mild   History of abnormal cervical Pap smear 2012, 2013, 2016   Hypercholesteremia     Past Surgical History:  Procedure Laterality Date   NO PAST SURGERIES      Family History   Problem Relation Age of Onset   Diabetes Paternal Grandmother    Cancer Maternal Grandmother    Hypercholesterolemia Mother    Hypercholesterolemia Father    Diabetes Brother     Social History   Socioeconomic History   Marital status: Soil scientist    Spouse name: Corinna Capra   Number of children: 2   Years of education: Not on file   Highest education level: Bachelor's degree (e.g., BA, AB, BS)  Occupational History   Occupation: Pharmacist, hospital     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  youngest child ( boy ) also has an older girl    Social Determinants of Radio broadcast assistant Strain: Low Risk  (10/24/2018)   Overall Financial Resource Strain (CARDIA)    Difficulty of Paying Living Expenses: Not hard at all  Food Insecurity: No Food Insecurity (10/24/2018)   Hunger Vital Sign    Worried About Running Out of Food in the Last Year: Never true    Ran Out of Food in the Last Year: Never true  Transportation Needs: No Transportation Needs (10/24/2018)   PRAPARE - Hydrologist (Medical): No    Lack of Transportation (Non-Medical): No  Physical Activity: Insufficiently Active (10/24/2018)   Exercise Vital Sign    Days of Exercise per Week: 3 days    Minutes of Exercise per Session: 30 min  Stress: No Stress Concern Present (10/24/2018)   [place]    Feeling of Stress : Not at all  Social Connections: Moderately Integrated (10/24/2018)   Social Connection and  Isolation Panel [NHANES]    Frequency of Communication with Friends and Family: More than three times a week    Frequency of Social Gatherings with Friends and Family: Three times a week    Attends Religious Services: 1 to 4 times per year    Active Member of Clubs or Organizations: No    Attends Archivist Meetings: Never    Marital Status: Living with partner  Intimate Partner Violence: Not At Risk (10/24/2018)   Humiliation, Afraid, Rape, and Kick questionnaire    Fear of Current or Ex-Partner: No    Emotionally Abused: No    Physically Abused: No    Sexually Abused: No    Current Outpatient Medications on File Prior to Visit  Medication Sig Dispense Refill   lubiprostone (AMITIZA) 24 MCG capsule Take 1 capsule (24 mcg total) by mouth 2 (two) times daily with a meal. 60 capsule 2   Current Facility-Administered Medications on File Prior to Visit  Medication Dose Route Frequency Provider Last Rate Last Admin   levonorgestrel (MIRENA) 20 MCG/24HR IUD   Intrauterine Once Schuman, Christanna R, MD        No Known Allergies   Review of Systems Constitutional: negative for chills, fatigue, fevers and sweats Eyes: negative for irritation, redness and visual disturbance Ears, nose, mouth, throat, and face: negative for hearing loss, nasal congestion, snoring and tinnitus Respiratory: negative for asthma, cough, sputum Cardiovascular: negative for chest pain, dyspnea, exertional chest pressure/discomfort, irregular heart beat, palpitations and syncope Gastrointestinal: negative for abdominal pain, change in bowel habits, nausea and vomiting Genitourinary: negative for abnormal menstrual periods, genital lesions, sexual problems and vaginal discharge, dysuria and urinary incontinence Integument/breast: negative for breast lump, breast tenderness and nipple discharge Hematologic/lymphatic: negative for bleeding and easy bruising Musculoskeletal:negative for back pain and muscle  weakness Neurological: negative for dizziness, headaches, vertigo and weakness Endocrine: negative for diabetic symptoms including polydipsia, polyuria and skin dryness Allergic/Immunologic: negative for hay fever and urticaria      Objective:   Blood pressure 126/80, pulse 97, resp. rate 16, height 5\' 2"  (1.575 m), weight 181 lb 14.4 oz (82.5 kg), currently breastfeeding. Body mass index is 33.27 kg/m.    General Appearance:    Alert, cooperative, no distress, appears stated age, mild obesity  Head:    Normocephalic, without obvious abnormality, atraumatic  Eyes:    PERRL, conjunctiva/corneas clear, EOM's intact, both eyes  Ears:  Normal external ear canals, both ears  Nose:   Nares normal, septum midline, mucosa normal, no drainage or sinus tenderness  Throat:   Lips, mucosa, and tongue normal; teeth and gums normal  Neck:   Supple, symmetrical, trachea midline, no adenopathy; thyroid: no enlargement/tenderness/nodules; no carotid bruit or JVD  Back:     Symmetric, no curvature, ROM normal, no CVA tenderness  Lungs:     Clear to auscultation bilaterally, respirations unlabored  Chest Wall:    No tenderness or deformity   Heart:    Regular rate and rhythm, S1 and S2 normal, no murmur, rub or gallop  Breast Exam:    No tenderness, masses, or nipple abnormality  Abdomen:     Soft, non-tender, bowel sounds active all four quadrants, no masses, no organomegaly.    Genitalia:    Pelvic:external genitalia normal, vagina without lesions, discharge, or tenderness, rectovaginal septum  normal. Cervix normal in appearance, no cervical motion tenderness, IUD threads not visible on today's exam. No adnexal masses or tenderness.  Uterus normal size, shape, mobile, regular contours, nontender.  Rectal:    Normal external sphincter.  No hemorrhoids appreciated. Internal exam not done.   Extremities:   Extremities normal, atraumatic, no cyanosis or edema  Pulses:   2+ and symmetric all extremities   Skin:   Skin color, texture, turgor normal, no rashes or lesions  Lymph nodes:   Cervical, supraclavicular, and axillary nodes normal  Neurologic:   CNII-XII intact, normal strength, sensation and reflexes throughout   .  Labs:  Lab Results  Component Value Date   WBC 11.9 (H) 10/25/2020   HGB 14.0 10/25/2020   HCT 41.4 10/25/2020   MCV 92.6 10/25/2020   PLT 284 10/25/2020    Lab Results  Component Value Date   CREATININE 0.74 10/25/2020   BUN 11 10/25/2020   NA 137 10/25/2020   K 3.7 10/25/2020   CL 100 10/25/2020   CO2 28 10/25/2020    Lab Results  Component Value Date   ALT 15 10/25/2020   AST 20 10/25/2020   ALKPHOS 82 09/21/2019   BILITOT 0.3 10/25/2020    Lab Results  Component Value Date   TSH 1.34 10/25/2019     Assessment:   1. Well woman exam with routine gynecological exam   2. Intrauterine contraceptive device threads lost, initial encounter   3. Obesity (BMI 30.0-34.9)   4. Pure hypercholesterolemia   5. Vitamin D deficiency      Plan:  Blood tests: None ordered.  Patient will plan to have labs by her PCP later in the year.. Breast self exam technique reviewed and patient encouraged to perform self-exam monthly. Contraception: IUD.  Inserted 2019.  Patient aware that IUD is good for at up to 8 years Discussed healthy lifestyle modifications. Mammogram  : Not age appropriate Pap smear : UTD.  Due for repeat in 1 year. COVID vaccination status: Has completed Pfizer vaccination series.  Is eligible for booster if she has not already received. Hypercholesterolemia and vitamin D deficiency to be assessed again by PCP later this year. Follow up in 1 year for annual exam   Rubie Maid, MD [facility]

## 2022-07-29 ENCOUNTER — Ambulatory Visit (INDEPENDENT_AMBULATORY_CARE_PROVIDER_SITE_OTHER): Payer: Self-pay | Admitting: Nurse Practitioner

## 2022-07-29 ENCOUNTER — Ambulatory Visit: Payer: Self-pay | Admitting: Nurse Practitioner

## 2022-07-29 ENCOUNTER — Encounter: Payer: Self-pay | Admitting: Nurse Practitioner

## 2022-07-29 VITALS — BP 118/74 | HR 98 | Temp 98.3°F | Resp 18 | Ht 62.0 in | Wt 195.3 lb

## 2022-07-29 DIAGNOSIS — S93106A Unspecified dislocation of unspecified toe(s), initial encounter: Secondary | ICD-10-CM

## 2022-07-29 DIAGNOSIS — R1084 Generalized abdominal pain: Secondary | ICD-10-CM | POA: Diagnosis not present

## 2022-07-29 DIAGNOSIS — L608 Other nail disorders: Secondary | ICD-10-CM

## 2022-07-29 LAB — POCT URINALYSIS DIPSTICK
Bilirubin, UA: NEGATIVE
Blood, UA: NEGATIVE
Glucose, UA: NEGATIVE
Ketones, UA: NEGATIVE
Leukocytes, UA: NEGATIVE
Nitrite, UA: NEGATIVE
Odor: NORMAL
Protein, UA: NEGATIVE
Spec Grav, UA: 1.02 (ref 1.010–1.025)
Urobilinogen, UA: 0.2 E.U./dL
pH, UA: 6.5 (ref 5.0–8.0)

## 2022-07-29 LAB — POCT URINE PREGNANCY: Preg Test, Ur: NEGATIVE

## 2022-07-29 NOTE — Progress Notes (Addendum)
BP 118/74   Pulse 98   Temp 98.3 F (36.8 C)   Resp 18   Ht 5\' 2"  (1.575 m)   Wt 195 lb 4.8 oz (88.6 kg)   SpO2 98%   BMI 35.72 kg/m    Subjective:    Patient ID: Sierra Clay, female    DOB: 1990-11-17, 31 y.o.   MRN: [id number]  HPI: Sierra Clay is a 31 y.o. female  Chief Complaint  Patient presents with   Foot Problem   Toe bruising:  she says that over the last month she has noticed that she gets these black spots on her nail on either great toe.  She denies any trauma.  Will send patient to podiatry.   Abdominal bloating/ diarrhea/ generalized pain:  she says that she has been having muddy stools, generalized abdominal pain that comes and goes.  She also reports bloating is constant.  She says this has been going on since August.  She has an IUD in place. She denies any fever, nausea, vomiting, blood in stool or constipation. She does report some urinary frequency.  Will get labs and stool sample. Will also get upreg and urine dip. Urine dip and upreg were negative.   Relevant past medical, surgical, family and social history reviewed and updated as indicated. Interim medical history since our last visit reviewed. Allergies and medications reviewed and updated.  Review of Systems  Constitutional: Negative for fever or weight change.  Respiratory: Negative for cough and shortness of breath.   Cardiovascular: Negative for chest pain or palpitations.  Gastrointestinal: positive for abdominal pain, loose bowel movements Musculoskeletal: Negative for gait problem or joint swelling.  Skin: Negative for rash.  Toe nail:  she has a discoloration on her left great toe Neurological: Negative for dizziness or headache.  No other specific complaints in a complete review of systems (except as listed in HPI above).      Objective:    BP 118/74   Pulse 98   Temp 98.3 F (36.8 C)   Resp 18   Ht 5\' 2"  (1.575 m)   Wt 195 lb 4.8 oz (88.6 kg)   SpO2 98%   BMI 35.72  kg/m   Wt Readings from Last 3 Encounters:  07/29/22 195 lb 4.8 oz (88.6 kg)  01/13/22 181 lb 14.4 oz (82.5 kg)  01/10/21 179 lb 9.6 oz (81.5 kg)    Physical Exam  Constitutional: Patient appears well-developed and well-nourished. Obese  No distress.  HEENT: head atraumatic, normocephalic, pupils equal and reactive to light, neck supple, throat within normal limits Cardiovascular: Normal rate, regular rhythm and normal heart sounds.  No murmur heard. No BLE edema. Pulmonary/Chest: Effort normal and breath sounds normal. No respiratory distress. Abdominal: Soft.  Generalized tenderness Toe nail:  she has a discoloration on her left great toe Psychiatric: Patient has a normal mood and affect. behavior is normal. Judgment and thought content normal.     Assessment & Plan:   Problem List Items Addressed This Visit   None Visit Diagnoses     Discolored nails    -  Primary   referral placed to podiatry   Relevant Orders   Ambulatory referral to Podiatry   Generalized abdominal pain       getting urine dip, upreg, labs, stool sample.   Relevant Orders   CBC with Differential/Platelet (Completed)   COMPLETE METABOLIC PANEL WITH GFR (Completed)   POCT urinalysis dipstick (Completed)   POCT urine pregnancy (  Completed)   Lipase (Completed)   CALPROTECTIN   Celiac Disease Panel (Completed)   Ova and parasite examination (Completed)   Salmonella/Shigella Cult, Campy EIA and Shiga Toxin reflex (Completed)   Stool Giardia/Cryptosporidium        Follow up plan: Return if symptoms worsen or fail to improve.

## 2022-07-29 NOTE — Progress Notes (Deleted)
   There were no vitals taken for this visit.   Subjective:    Patient ID: Sierra Clay, female    DOB: 14-May-1991, 31 y.o.   MRN: [id number]  HPI: Sierra Clay is a 31 y.o. female  No chief complaint on file.   Relevant past medical, surgical, family and social history reviewed and updated as indicated. Interim medical history since our last visit reviewed. Allergies and medications reviewed and updated.  Review of Systems  Constitutional: Negative for fever or weight change.  Respiratory: Negative for cough and shortness of breath.   Cardiovascular: Negative for chest pain or palpitations.  Gastrointestinal: Negative for abdominal pain, no bowel changes.  Musculoskeletal: Negative for gait problem or joint swelling.  Skin: Negative for rash.  Neurological: Negative for dizziness or headache.  No other specific complaints in a complete review of systems (except as listed in HPI above).      Objective:    There were no vitals taken for this visit.  Wt Readings from Last 3 Encounters:  01/13/22 181 lb 14.4 oz (82.5 kg)  01/10/21 179 lb 9.6 oz (81.5 kg)  10/25/20 183 lb 9.6 oz (83.3 kg)    Physical Exam  Constitutional: Patient appears well-developed and well-nourished. Obese *** No distress.  HEENT: head atraumatic, normocephalic, pupils equal and reactive to light, ears ***, neck supple, throat within normal limits Cardiovascular: Normal rate, regular rhythm and normal heart sounds.  No murmur heard. No BLE edema. Pulmonary/Chest: Effort normal and breath sounds normal. No respiratory distress. Abdominal: Soft.  There is no tenderness. Psychiatric: Patient has a normal mood and affect. behavior is normal. Judgment and thought content normal.      Assessment & Plan:   Problem List Items Addressed This Visit   None    Follow up plan: No follow-ups on file.

## 2022-07-30 LAB — CELIAC DISEASE PANEL
(tTG) Ab, IgA: <1 U/mL
(tTG) Ab, IgG: 2 U/mL
Gliadin IgA: <1 U/mL
Gliadin IgG: <1 U/mL
Immunoglobulin A: 273 mg/dL (ref 47–310)

## 2022-07-30 LAB — COMPLETE METABOLIC PANEL WITH GFR
AG Ratio: 1.5 (calc) (ref 1.0–2.5)
ALT: 19 U/L (ref 6–29)
AST: 21 U/L (ref 10–30)
Albumin: 4.1 g/dL (ref 3.6–5.1)
Alkaline phosphatase (APISO): 63 U/L (ref 31–125)
BUN: 17 mg/dL (ref 7–25)
CO2: 27 mmol/L (ref 20–32)
Calcium: 9.3 mg/dL (ref 8.6–10.2)
Chloride: 103 mmol/L (ref 98–110)
Creat: 0.77 mg/dL (ref 0.50–0.97)
Globulin: 2.8 g/dL (calc) (ref 1.9–3.7)
Glucose, Bld: 86 mg/dL (ref 65–99)
Potassium: 3.8 mmol/L (ref 3.5–5.3)
Sodium: 139 mmol/L (ref 135–146)
Total Bilirubin: 0.3 mg/dL (ref 0.2–1.2)
Total Protein: 6.9 g/dL (ref 6.1–8.1)
eGFR: 105 mL/min/{1.73_m2} (ref >=60)

## 2022-07-30 LAB — CBC WITH DIFFERENTIAL/PLATELET
Absolute Monocytes: 555 cells/uL (ref 200–950)
Basophils Absolute: 38 cells/uL (ref 0–200)
Basophils Relative: 0.4 %
Eosinophils Absolute: 75 cells/uL (ref 15–500)
Eosinophils Relative: 0.8 %
HCT: 40.4 % (ref 35.0–45.0)
Hemoglobin: 13.6 g/dL (ref 11.7–15.5)
Lymphs Abs: 3328 cells/uL (ref 850–3900)
MCH: 31 pg (ref 27.0–33.0)
MCHC: 33.7 g/dL (ref 32.0–36.0)
MCV: 92 fL (ref 80.0–100.0)
MPV: 10.1 fL (ref 7.5–12.5)
Monocytes Relative: 5.9 %
Neutro Abs: 5405 cells/uL (ref 1500–7800)
Neutrophils Relative %: 57.5 %
Platelets: 259 10*3/uL (ref 140–400)
RBC: 4.39 10*6/uL (ref 3.80–5.10)
RDW: 12.3 % (ref 11.0–15.0)
Total Lymphocyte: 35.4 %
WBC: 9.4 10*3/uL (ref 3.8–10.8)

## 2022-07-30 LAB — LIPASE: Lipase: 24 U/L (ref 7–60)

## 2022-07-31 DIAGNOSIS — R1084 Generalized abdominal pain: Secondary | ICD-10-CM | POA: Diagnosis not present

## 2022-08-05 LAB — GIARDIA AND CRYPTOSPORIDIUM ANTIGEN PANEL
MICRO NUMBER:: 14370295
RESULT:: NOT DETECTED
SPECIMEN QUALITY:: ADEQUATE
Specimen Quality:: ADEQUATE
micro Number:: 14370293

## 2022-08-05 LAB — SALMONELLA/SHIGELLA CULT, CAMPY EIA AND SHIGA TOXIN RFL ECOLI
MICRO NUMBER: 14370292
MICRO NUMBER:: 14370294
MICRO NUMBER:: 14370297
Result:: NOT DETECTED
SHIGA RESULT:: NOT DETECTED
SPECIMEN QUALITY: ADEQUATE
SPECIMEN QUALITY:: ADEQUATE
SPECIMEN QUALITY:: ADEQUATE

## 2022-08-05 LAB — OVA AND PARASITE EXAMINATION
CONCENTRATE RESULT:: NONE SEEN
MICRO NUMBER:: 14370296
SPECIMEN QUALITY:: ADEQUATE
TRICHROME RESULT:: NONE SEEN

## 2022-08-06 ENCOUNTER — Ambulatory Visit: Payer: BC Managed Care – PPO | Admitting: Podiatry

## 2022-08-06 ENCOUNTER — Encounter: Payer: Self-pay | Admitting: Podiatry

## 2022-08-06 ENCOUNTER — Other Ambulatory Visit: Payer: Self-pay | Admitting: Nurse Practitioner

## 2022-08-06 VITALS — BP 118/68

## 2022-08-06 DIAGNOSIS — K5900 Constipation, unspecified: Secondary | ICD-10-CM

## 2022-08-06 DIAGNOSIS — R197 Diarrhea, unspecified: Secondary | ICD-10-CM

## 2022-08-06 DIAGNOSIS — R140 Abdominal distension (gaseous): Secondary | ICD-10-CM

## 2022-08-06 DIAGNOSIS — M722 Plantar fascial fibromatosis: Secondary | ICD-10-CM | POA: Diagnosis not present

## 2022-08-06 DIAGNOSIS — R1084 Generalized abdominal pain: Secondary | ICD-10-CM

## 2022-08-06 LAB — CALPROTECTIN: Calprotectin: 63 mcg/g

## 2022-08-06 NOTE — Progress Notes (Signed)
  Subjective:  Patient ID: Sierra Clay, female    DOB: Aug 13, 1990,  MRN: [id number]  Chief Complaint  Patient presents with   Nail Problem    32 y.o. female presents with the above complaint.  Patient presents with bilateral heel pain that has been going for quite some time is progressive gotten worse hurts with ambulation hurts with pressure.  Pain scale 7 out of 10.  She would like to discuss treatment options for it.  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   Review of Systems: Negative except as noted in the HPI. Denies N/V/F/Ch.  Past Medical History:  Diagnosis Date   Asthma    Mild   History of abnormal cervical Pap smear 2012, 2013, 2016   Hypercholesteremia    No current outpatient medications on file.  Current Facility-Administered Medications:    levonorgestrel (MIRENA) 20 MCG/24HR IUD, , Intrauterine, Once, Schuman, Christanna R, MD  Social History   Tobacco Use  Smoking Status Never  Smokeless Tobacco Never    No Known Allergies Objective:   Vitals:   08/06/22 1536  BP: 118/68   There is no height or weight on file to calculate BMI. Constitutional Well developed. Well nourished.  Vascular Dorsalis pedis pulses palpable bilaterally. Posterior tibial pulses palpable bilaterally. Capillary refill normal to all digits.  No cyanosis or clubbing noted. Pedal hair growth normal.  Neurologic Normal speech. Oriented to person, place, and time. Epicritic sensation to light touch grossly present bilaterally.  Dermatologic Nails well groomed and normal in appearance. No open wounds. No skin lesions.  Orthopedic: Normal joint ROM without pain or crepitus bilaterally. No visible deformities. Tender to palpation at the calcaneal tuber bilaterally. No pain with calcaneal squeeze bilaterally. Ankle ROM diminished range of motion bilaterally. Silfverskiold Test: positive bilaterally.   Radiographs: None  Assessment:   1. Plantar fasciitis, right   2.  Plantar fasciitis, left    Plan:  Patient was evaluated and treated and all questions answered.  Plantar Fasciitis, bilaterally - XR reviewed as above.  - Educated on icing and stretching. Instructions given.  - Injection delivered to the plantar fascia as below. - DME: Plantar fascial brace dispensed to support the medial longitudinal arch of the foot and offload pressure from the heel and prevent arch collapse during weightbearing - Pharmacologic management: None  Procedure: Injection Tendon/Ligament Location: Bilateral plantar fascia at the glabrous junction; medial approach. Skin Prep: alcohol Injectate: 0.5 cc 0.5% marcaine plain, 0.5 cc of 1% Lidocaine, 0.5 cc kenalog 10. Disposition: Patient tolerated procedure well. Injection site dressed with a band-aid.  No follow-ups on file.

## 2022-08-19 ENCOUNTER — Ambulatory Visit: Payer: BC Managed Care – PPO | Admitting: Obstetrics and Gynecology

## 2022-08-19 ENCOUNTER — Encounter: Payer: Self-pay | Admitting: Obstetrics and Gynecology

## 2022-08-19 VITALS — BP 136/89 | HR 96 | Resp 16 | Ht 62.0 in | Wt 196.8 lb

## 2022-08-19 DIAGNOSIS — Z30431 Encounter for routine checking of intrauterine contraceptive device: Secondary | ICD-10-CM

## 2022-08-19 NOTE — Progress Notes (Signed)
    GYNECOLOGY OFFICE ENCOUNTER NOTE  History:  32 y.o. [id number] here today for today for IUD string check; Mirena  IUD was placed  02/14/2018. Notes that she has not been able to feel the strings recently. Strings also were not visible at last annual exam in June.  Notes she is not having cycles. Also has concerns about increasing amount of discharge. Denies odor or itching/burning. Just now having to wear a pantyliner.   The following portions of the patient's history were reviewed and updated as appropriate: allergies, current medications, past family history, past medical history, past social history, past surgical history and problem list. Last pap smear on 11/16/2019 was ASC-US with negative HRHPV.  Review of Systems:  Pertinent items are noted in HPI.  Objective:  Physical Exam Blood pressure 136/89, pulse 96, resp. rate 16, height 5\' 2"  (1.575 m), weight 196 lb 12.8 oz (89.3 kg), currently breastfeeding.  CONSTITUTIONAL: Well-developed, well-nourished female in no acute distress.  NEUROLOGIC: Alert and oriented to person, place, and time. Normal reflexes, muscle tone coordination.  ABDOMEN: Soft, no distention noted.   PELVIC: Normal appearing external genitalia; normal appearing vaginal mucosa and cervix.  IUD not readily visible. Able to be retrieved with cytobrush and tonsil forceps.  Strings brought out beyond cervix to be 0.5 cm in length. Done in the presence of a chaperone.  EXTREMITIES: Non-tender, no edema or cyanosis  Assessment & Plan:  - Patient to keep IUD in place for up to 8 years (due for removal in 2027); can come in for removal if she desires pregnancy earlier or for any concerning side effects. - Advised that increase in discharge may likely be due to affects of IUD.    Rubie Maid, MD [place]

## 2022-09-01 ENCOUNTER — Ambulatory Visit: Payer: BC Managed Care – PPO | Admitting: Podiatry

## 2022-09-01 DIAGNOSIS — Q666 Other congenital valgus deformities of feet: Secondary | ICD-10-CM

## 2022-09-01 NOTE — Progress Notes (Signed)
  Subjective:  Patient ID: Sierra Clay, female    DOB: 1990/08/19,  MRN: [id number]  Chief Complaint  Patient presents with   Plantar Fasciitis    32 y.o. female presents with the above complaint.  Patient presents for follow-up of bilateral Planter fasciitis.  She states she is doing a lot better.  The injection helped considerably.  She would like to discuss next treatment plan.   Review of Systems: Negative except as noted in the HPI. Denies N/V/F/Ch.  Past Medical History:  Diagnosis Date   Asthma    Mild   History of abnormal cervical Pap smear 2012, 2013, 2016   Hypercholesteremia    No current outpatient medications on file.  Current Facility-Administered Medications:    levonorgestrel (MIRENA) 20 MCG/24HR IUD, , Intrauterine, Once, Schuman, Christanna R, MD  Social History   Tobacco Use  Smoking Status Never  Smokeless Tobacco Never    No Known Allergies Objective:   There were no vitals filed for this visit.  There is no height or weight on file to calculate BMI. Constitutional Well developed. Well nourished.  Vascular Dorsalis pedis pulses palpable bilaterally. Posterior tibial pulses palpable bilaterally. Capillary refill normal to all digits.  No cyanosis or clubbing noted. Pedal hair growth normal.  Neurologic Normal speech. Oriented to person, place, and time. Epicritic sensation to light touch grossly present bilaterally.  Dermatologic Nails well groomed and normal in appearance. No open wounds. No skin lesions.  Orthopedic: Normal joint ROM without pain or crepitus bilaterally. No visible deformities. No further tender to palpation at the calcaneal tuber bilaterally. No pain with calcaneal squeeze bilaterally. Ankle ROM diminished range of motion bilaterally. Silfverskiold Test: positive bilaterally.   Radiographs: None  Assessment:   1. Pes planovalgus     Plan:  Patient was evaluated and treated and all questions  answered.  Plantar Fasciitis, bilaterally -Clinically resolved just wants steroid shot.  At this time I discussed shoe gear modification and orthotics management.  She states understand like to proceed with orthotics.  Pes planovalgus -I explained to patient the etiology of pes planovalgus and relationship with Planter fasciitis and various treatment options were discussed.  Given patient foot structure in the setting of Planter fasciitis I believe patient will benefit from custom-made orthotics to help control the hindfoot motion support the arch of the foot and take the stress away from plantar fascial.  Patient agrees with the plan like to proceed with orthotics -Patient was casted for orthotics .  No follow-ups on file.

## 2022-10-14 ENCOUNTER — Telehealth: Payer: Self-pay | Admitting: Podiatry

## 2022-10-14 NOTE — Telephone Encounter (Signed)
Lmom to call back to pick up orthotics they are currently in the [place] office , will ship to [place] once scheduled   Balance is [phone]

## 2022-11-03 NOTE — Telephone Encounter (Signed)
Lmom to call back to pick up orthotics they are currently in the [place] office , will ship to [place] once scheduled   Balance is [phone] 

## 2022-11-05 ENCOUNTER — Ambulatory Visit (INDEPENDENT_AMBULATORY_CARE_PROVIDER_SITE_OTHER): Payer: BC Managed Care – PPO | Admitting: *Deleted

## 2022-11-05 DIAGNOSIS — Q666 Other congenital valgus deformities of feet: Secondary | ICD-10-CM

## 2022-11-05 DIAGNOSIS — M722 Plantar fascial fibromatosis: Secondary | ICD-10-CM

## 2022-11-05 NOTE — Progress Notes (Signed)
Patient presents today to pick up custom molded foot orthotics recommended by Dr. PATEL.   Orthotics were dispensed and fit was satisfactory. Reviewed instructions for break-in and wear. Written instructions given to patient.  Patient will follow up as needed.   

## 2022-12-01 ENCOUNTER — Encounter: Payer: Self-pay | Admitting: Gastroenterology

## 2022-12-01 ENCOUNTER — Ambulatory Visit: Payer: BC Managed Care – PPO | Admitting: Gastroenterology

## 2022-12-01 VITALS — BP 132/89 | HR 89 | Temp 98.6°F | Ht 62.0 in | Wt 198.4 lb

## 2022-12-01 DIAGNOSIS — R109 Unspecified abdominal pain: Secondary | ICD-10-CM | POA: Diagnosis not present

## 2022-12-01 DIAGNOSIS — R140 Abdominal distension (gaseous): Secondary | ICD-10-CM | POA: Diagnosis not present

## 2022-12-01 NOTE — Progress Notes (Signed)
Sierra Mood MD, MRCP(U.K) [address]  [address]  [place], [place] [address]  Main: [phone]  Fax: [phone]   Gastroenterology Consultation  Referring Provider:     Alba Cory, MD Primary Care Physician:  Sierra Cory, MD Primary Gastroenterologist:  Dr. Wyline Clay  Reason for Consultation:   Abdominal pain constipation        HPI:   Sierra Clay is a 32 y.o. y/o female referred for consultation & management  by Dr. Carlynn Clay, Sierra Hefty, MD.  She says she is here to see me for abdominal discomfort going on for a few months complains of bilateral right and left lower quadrant abdominal discomfort occurring almost every day prior to bowel movement and when she lays flat associated with abdominal distention excessive flatulence and mushy stool which is foul-smelling.  Denies any blood in the stool denies any unintentional weight loss denies any rectal bleeding no family history of colon cancer.  She states she chews chewing gum all day long consume sugar with additives that last creamers and it.  No other complaints.  The pain is localized not worse with food intake.  She does not have her.  As she is on birth control. No recent abdominal imaging 07/29/2022 hemoglobin 13.6 g CMP normal last celiac serology negative fecal calprotectin 63  Past Medical History:  Diagnosis Date   Asthma    Mild   History of abnormal cervical Pap smear 2012, 2013, 2016   Hypercholesteremia     Past Surgical History:  Procedure Laterality Date   NO PAST SURGERIES      Prior to Admission medications   Not on File    Family History  Problem Relation Age of Onset   Diabetes Paternal Grandmother    Cancer Maternal Grandmother    Hypercholesterolemia Mother    Hypercholesterolemia Father    Diabetes Brother      Social History   Tobacco Use   Smoking status: Never   Smokeless tobacco: Never  Vaping Use   Vaping Use: Never used  Substance Use Topics   Alcohol use: No    Drug use: No    Allergies as of 12/01/2022   (No Known Allergies)    Review of Systems:    All systems reviewed and negative except where noted in HPI.   Physical Exam:  BP 132/89   Pulse 89   Temp 98.6 F (37 C) (Oral)   Ht 5\' 2"  (1.575 m)   Wt 198 lb 6 oz (90 kg)   BMI 36.28 kg/m  No LMP recorded. (Menstrual status: IUD). Psych:  Alert and cooperative. Normal Clay and affect. General:   Alert,  Well-developed, well-nourished, pleasant and cooperative in NAD Head:  Normocephalic and atraumatic. Eyes:  Sclera clear, no icterus.   Conjunctiva pink. Ears:  Normal auditory acuity. Abdomen:  Normal bowel sounds.  No bruits.  Soft, non-tender and non-distended without masses, hepatosplenomegaly or hernias noted.  No guarding or rebound tenderness.    Neurologic:  Alert and oriented x3;  grossly normal neurologically. Psych:  Alert and cooperative. Normal Clay and affect.  Imaging Studies: No results found.  Assessment and Plan:   Sierra Clay is a 32 y.o. y/o female has been referred for abdominal discomfort based on her history very likely related to bloating and gas from excessive consumption of chewing gum which has artificial sugars and sweeteners which cannot only cause bloating but also because mushy stools due to osmotic diarrhea.  In addition  chewing gum all day long may lead to aerophagia and because bloating.  I advised her to stop all gum, artificial sugars weakness creamers for about 7 to 10 days and see how she does if her symptoms resolve no further evaluation will be needed if the symptoms persist may need to consider further imaging of the abdomen as well as possibly colonoscopy.  Follow up in as needed  Dr Sierra Mood MD,MRCP(U.K)

## 2023-03-01 ENCOUNTER — Encounter: Payer: Self-pay | Admitting: Nurse Practitioner

## 2023-03-01 ENCOUNTER — Other Ambulatory Visit: Payer: Self-pay

## 2023-03-01 ENCOUNTER — Ambulatory Visit (INDEPENDENT_AMBULATORY_CARE_PROVIDER_SITE_OTHER): Payer: BC Managed Care – PPO | Admitting: Nurse Practitioner

## 2023-03-01 VITALS — BP 112/72 | HR 98 | Temp 97.9°F | Resp 16 | Ht 62.0 in | Wt 202.5 lb

## 2023-03-01 DIAGNOSIS — J4520 Mild intermittent asthma, uncomplicated: Secondary | ICD-10-CM | POA: Diagnosis not present

## 2023-03-01 DIAGNOSIS — B360 Pityriasis versicolor: Secondary | ICD-10-CM | POA: Diagnosis not present

## 2023-03-01 MED ORDER — KETOCONAZOLE 2 % EX CREA: 1.0000 | TOPICAL_CREAM | Freq: Two times a day (BID) | CUTANEOUS | 1 refills | Status: DC

## 2023-03-01 MED ORDER — AIRSUPRA 90-80 MCG/ACT IN AERO: 2.0000 | INHALATION_SPRAY | RESPIRATORY_TRACT | 5 refills | Status: DC | PRN

## 2023-03-01 NOTE — Progress Notes (Signed)
BP 112/72   Pulse 98   Temp 97.9 F (36.6 C) (Oral)   Resp 16   Ht 5\' 2"  (1.575 m)   Wt 202 lb 8 oz (91.9 kg)   SpO2 98%   BMI 37.04 kg/m    Subjective:    Patient ID: Sierra Clay, female    DOB: 02/08/1991, 32 y.o.   MRN: [id number]  HPI: Sierra Clay is a 32 y.o. female  Chief Complaint  Patient presents with   Asthma    Refill inhaler   Rash    Refill nizoral   Asthma:    -Asthma status: uncontrolled -Current Treatments: was on albuterol  -Satisfied with current treatment?: no -Albuterol/rescue inhaler frequency: needs an inhaler -Dyspnea frequency: frequently when in school  -Wheezing frequency:frequently when in school -Cough frequency: frequently when in school -Limitation of activity: yes -Current upper respiratory symptoms: no -Triggers: dust at school -Asthma meds in past: albuterol -Aerochamber/spacer use: no -Visits to ER or Urgent Care in past year: yes -Pneumovax: Not up to Date -Influenza: Up to Date  - will send in [place] to start  Tinea versicolor:  patient reports rash on neck and arms. patient reports she has had this in the past and has been using selsun blue to help. She says previously she was given a cream to help. Will send in ketoconazole cream.    Relevant past medical, surgical, family and social history reviewed and updated as indicated. Interim medical history since our last visit reviewed. Allergies and medications reviewed and updated.  Review of Systems  Constitutional: Negative for fever or weight change.  Respiratory: Negative for cough and shortness of breath.   Cardiovascular: Negative for chest pain or palpitations.  Gastrointestinal: Negative for abdominal pain, no bowel changes.  Musculoskeletal: Negative for gait problem or joint swelling.  Skin: positive for rash.  Neurological: Negative for dizziness or headache.  No other specific complaints in a complete review of systems (except as listed in HPI above).       Objective:    BP 112/72   Pulse 98   Temp 97.9 F (36.6 C) (Oral)   Resp 16   Ht 5\' 2"  (1.575 m)   Wt 202 lb 8 oz (91.9 kg)   SpO2 98%   BMI 37.04 kg/m   Wt Readings from Last 3 Encounters:  03/01/23 202 lb 8 oz (91.9 kg)  12/01/22 198 lb 6 oz (90 kg)  08/19/22 196 lb 12.8 oz (89.3 kg)    Physical Exam  Constitutional: Patient appears well-developed and well-nourished. Obese  No distress.  HEENT: head atraumatic, normocephalic, pupils equal and reactive to light, neck supple Cardiovascular: Normal rate, regular rhythm and normal heart sounds.  No murmur heard. No BLE edema. Pulmonary/Chest: Effort normal and breath sounds normal. No respiratory distress. Abdominal: Soft.  There is no tenderness. Skin :tinea versicolor  Psychiatric: Patient has a normal mood and affect. behavior is normal. Judgment and thought content normal.   Results for orders placed or performed in visit on 07/29/22  CALPROTECTIN  Result Value Ref Range   Calprotectin 63 mcg/g  Ova and parasite examination   Specimen: Stool  Result Value Ref Range   MICRO NUMBER: [id number]    SPECIMEN QUALITY: Adequate    Source STOOL    STATUS: FINAL    CONCENTRATE RESULT: No ova or parasites seen    TRICHROME RESULT: No ova or parasites seen    COMMENT:      Routine Ova  and Parasite exam may not detect some parasites that occasionally cause diarrheal illness. Cryptosporidium Antigen and/or Cyclospora and Isospora Exam may be ordered to detect these parasites. One negative sample does not necessarily rule out  the presence of a parasitic infection.  For additional information, please refer to https://education.[facility].com/faq/FAQ203 (This link is being provided for informational/ educational purposes only.)   CBC with Differential/Platelet  Result Value Ref Range   WBC 9.4 3.8 - 10.8 Thousand/uL   RBC 4.39 3.80 - 5.10 Million/uL   Hemoglobin 13.6 11.7 - 15.5 g/dL   HCT 98.1 19.1 - 47.8 %   MCV  92.0 80.0 - 100.0 fL   MCH 31.0 27.0 - 33.0 pg   MCHC 33.7 32.0 - 36.0 g/dL   RDW 29.5 62.1 - 30.8 %   Platelets 259 140 - 400 Thousand/uL   MPV 10.1 7.5 - 12.5 fL   Neutro Abs 5,405 1,500 - 7,800 cells/uL   Lymphs Abs 3,328 850 - 3,900 cells/uL   Absolute Monocytes 555 200 - 950 cells/uL   Eosinophils Absolute 75 15 - 500 cells/uL   Basophils Absolute 38 0 - 200 cells/uL   Neutrophils Relative % 57.5 %   Total Lymphocyte 35.4 %   Monocytes Relative 5.9 %   Eosinophils Relative 0.8 %   Basophils Relative 0.4 %  COMPLETE METABOLIC PANEL WITH GFR  Result Value Ref Range   Glucose, Bld 86 65 - 99 mg/dL   BUN 17 7 - 25 mg/dL   Creat 6.57 8.46 - 9.62 mg/dL   eGFR 952 > OR = 60 WU/XLK/4.40N0   BUN/Creatinine Ratio SEE NOTE: 6 - 22 (calc)   Sodium 139 135 - 146 mmol/L   Potassium 3.8 3.5 - 5.3 mmol/L   Chloride 103 98 - 110 mmol/L   CO2 27 20 - 32 mmol/L   Calcium 9.3 8.6 - 10.2 mg/dL   Total Protein 6.9 6.1 - 8.1 g/dL   Albumin 4.1 3.6 - 5.1 g/dL   Globulin 2.8 1.9 - 3.7 g/dL (calc)   AG Ratio 1.5 1.0 - 2.5 (calc)   Total Bilirubin 0.3 0.2 - 1.2 mg/dL   Alkaline phosphatase (APISO) 63 31 - 125 U/L   AST 21 10 - 30 U/L   ALT 19 6 - 29 U/L  Lipase  Result Value Ref Range   Lipase 24 7 - 60 U/L  Celiac Disease Panel  Result Value Ref Range   Immunoglobulin A 273 47 - 310 mg/dL   Gliadin IgA <2.7 U/mL   Gliadin IgG <1.0 U/mL   (tTG) Ab, IgG 2.0 U/mL   (tTG) Ab, IgA <1.0 U/mL  Salmonella/Shigella Cult, Campy EIA and Shiga Toxin reflex  Result Value Ref Range   MICRO NUMBER [id number]    SPECIMEN QUALITY Adequate    SOURCE: STOOL    STATUS: FINAL    Result: Not Detected    MICRO NUMBER: [id number]    SPECIMEN QUALITY: Adequate    SOURCE: STOOL    STATUS: FINAL    SHIGA RESULT: Not Detected    MICRO NUMBER: [id number]    SPECIMEN QUALITY: Adequate    SOURCE: STOOL    STATUS: FINAL    SS RESULT: No Salmonella or Shigella isolated   Giardia and Cryptosporidium Antigen Panel   Result Value Ref Range   micro Number: [id number]    Specimen Quality: Adequate    Source STOOL    Status: FINAL    Cryptosporidium Antigen, EIA      Not Detected  NOTE: Due to intermittent shedding, one negative sample does not necessarily rule out the presence of a parasitic infection.   MICRO NUMBER: [id number]    SPECIMEN QUALITY: Adequate    Source: STOOL    STATUS: FINAL    RESULT: Not Detected    COMMENT:      NOTE: Due to intermittent shedding, one negative sample does not necessarily rule out the presence of a parasitic infection.  POCT urinalysis dipstick  Result Value Ref Range   Color, UA yellow    Clarity, UA clear    Glucose, UA Negative Negative   Bilirubin, UA neg    Ketones, UA neg    Spec Grav, UA 1.020 1.010 - 1.025   Blood, UA neg    pH, UA 6.5 5.0 - 8.0   Protein, UA Negative Negative   Urobilinogen, UA 0.2 0.2 or 1.0 E.U./dL   Nitrite, UA neg    Leukocytes, UA Negative Negative   Appearance clear    Odor normal   POCT urine pregnancy  Result Value Ref Range   Preg Test, Ur Negative Negative      Assessment & Plan:   Problem List Items Addressed This Visit   None Visit Diagnoses     Tinea versicolor    -  Primary   continue selsun blue, can use ketoconazole cream   Relevant Medications   ketoconazole (NIZORAL) 2 % cream   Mild intermittent asthma without complication       start airsupra   Relevant Medications   Albuterol-Budesonide (AIRSUPRA) 90-80 MCG/ACT AERO        Follow up plan: Return for has cpe scheduled.

## 2023-06-07 ENCOUNTER — Ambulatory Visit (INDEPENDENT_AMBULATORY_CARE_PROVIDER_SITE_OTHER): Payer: BC Managed Care – PPO | Admitting: Family Medicine

## 2023-06-07 ENCOUNTER — Encounter: Payer: Self-pay | Admitting: Family Medicine

## 2023-06-07 ENCOUNTER — Other Ambulatory Visit (HOSPITAL_COMMUNITY)
Admission: RE | Admit: 2023-06-07 | Discharge: 2023-06-07 | Disposition: A | Discharge status: Home / Self Care | Payer: BC Managed Care – PPO | Source: Ambulatory Visit | Attending: Family Medicine | Admitting: Family Medicine

## 2023-06-07 VITALS — BP 122/70 | HR 91 | Resp 16 | Ht 63.0 in | Wt 201.0 lb

## 2023-06-07 DIAGNOSIS — E559 Vitamin D deficiency, unspecified: Secondary | ICD-10-CM | POA: Diagnosis not present

## 2023-06-07 DIAGNOSIS — Z0001 Encounter for general adult medical examination with abnormal findings: Secondary | ICD-10-CM | POA: Diagnosis not present

## 2023-06-07 DIAGNOSIS — Z833 Family history of diabetes mellitus: Secondary | ICD-10-CM

## 2023-06-07 DIAGNOSIS — Z0000 Encounter for general adult medical examination without abnormal findings: Secondary | ICD-10-CM | POA: Insufficient documentation

## 2023-06-07 DIAGNOSIS — Z124 Encounter for screening for malignant neoplasm of cervix: Secondary | ICD-10-CM

## 2023-06-07 DIAGNOSIS — E785 Hyperlipidemia, unspecified: Secondary | ICD-10-CM

## 2023-06-07 DIAGNOSIS — R635 Abnormal weight gain: Secondary | ICD-10-CM

## 2023-06-07 DIAGNOSIS — Z23 Encounter for immunization: Secondary | ICD-10-CM | POA: Diagnosis not present

## 2023-06-07 NOTE — Progress Notes (Signed)
Name: Sierra Clay   MRN: [id number]    DOB: 10/23/1990   Date:06/07/2023       Progress Note  Subjective  Chief Complaint  Annual Exam  HPI  Patient presents for annual CPE.  Diet: she packs her lunch, skips breakfast, cooks at home - discussed portion control. She is snacking around 3 pm  Exercise: discussed importance of increasing regular physical activity   Last Eye Exam: up to date  Last Dental Exam: up to date   Flowsheet Row Office Visit from 03/01/2023 in [facility]  AUDIT-C Score 0      Depression: Phq 9 is  negative    06/07/2023    2:45 PM 03/01/2023    3:43 PM 07/29/2022    2:41 PM 01/13/2022    2:56 PM 07/08/2021    2:50 PM  Depression screen PHQ 2/9  Decreased Interest 0 0 0 0 0  Down, Depressed, Hopeless 0 0 0 0 0  PHQ - 2 Score 0 0 0 0 0  Altered sleeping 0  0    Tired, decreased energy 0  0    Change in appetite 0  0    Feeling bad or failure about yourself  0  0    Trouble concentrating 0  0    Moving slowly or fidgety/restless 0  0    Suicidal thoughts 0  0    PHQ-9 Score 0  0    Difficult doing work/chores   Not difficult at all     Hypertension: BP Readings from Last 3 Encounters:  06/07/23 122/70  03/01/23 112/72  12/01/22 132/89   Obesity: Wt Readings from Last 3 Encounters:  06/07/23 201 lb (91.2 kg)  03/01/23 202 lb 8 oz (91.9 kg)  12/01/22 198 lb 6 oz (90 kg)   BMI Readings from Last 3 Encounters:  06/07/23 35.61 kg/m  03/01/23 37.04 kg/m  12/01/22 36.28 kg/m     Vaccines:   HPV: discussed with patient  Tdap: up to date Shingrix: N/A Pneumonia: discussed with patient  Flu: 2020, due today  COVID-19: up to date   Hep C Screening: 10/25/20 STD testing and prevention (HIV/chl/gon/syphilis): 10/25/19 Intimate partner violence: negative screen  Sexual History : one partner past 7 years , no pain during intercourse or vaginal discharge  Menstrual History/LMP/Abnormal Bleeding: she has an IUD  since 08/2022 no cycles Discussed importance of follow up if any post-menopausal bleeding: N/A Incontinence Symptoms: negative for symptoms   Breast cancer:  - Last Mammogram: N/A - BRCA gene screening: N/A   Osteoporosis Prevention : Discussed high calcium and vitamin D supplementation, weight bearing exercises Bone density: N/A   Cervical cancer screening: 11/16/19, due today   Skin cancer: Discussed monitoring for atypical lesions  Colorectal cancer: N/A   Lung cancer:  Low Dose CT Chest recommended if Age 51-80 years, 20 pack-year currently smoking OR have quit w/in 15years. Patient does not qualify for screen   ECG: N/A  Advanced Care Planning: A voluntary discussion about advance care planning including the explanation and discussion of advance directives.  Discussed health care proxy and Living will, and the patient was able to identify a health care proxy as mother .  Patient does not have a living will and power of attorney of health care   Lipids: Lab Results  Component Value Date   CHOL 234 (H) 10/25/2020   CHOL 203 (H) 10/25/2019   CHOL 239 (H) 10/06/2018   Lab Results  Component Value Date   HDL 55 10/25/2020   HDL 52 10/25/2019   HDL 68 10/06/2018   Lab Results  Component Value Date   LDLCALC 152 (H) 10/25/2020   LDLCALC 131 (H) 10/25/2019   LDLCALC 156 (H) 10/06/2018   Lab Results  Component Value Date   TRIG 141 10/25/2020   TRIG 101 10/25/2019   TRIG 75 10/06/2018   Lab Results  Component Value Date   CHOLHDL 4.3 10/25/2020   CHOLHDL 3.9 10/25/2019   CHOLHDL 3.5 10/06/2018   No results found for: "LDLDIRECT"  Glucose: Glucose, Bld  Date Value Ref Range Status  07/29/2022 86 65 - 99 mg/dL Final    Comment:    .            Fasting reference interval .   10/25/2020 96 65 - 99 mg/dL Final    Comment:    .            Fasting reference interval .   09/21/2019 101 (H) 70 - 99 mg/dL Final    Patient Active Problem List   Diagnosis Date  Noted   Atypical squamous cell changes of undetermined significance (ASCUS) on cervical cytology with negative high risk human papilloma virus (HPV) test result 01/10/2021   History of asthma 10/24/2018   Pure hypercholesterolemia 10/24/2018   Obesity (BMI 30.0-34.9) 10/24/2018   Vitamin D deficiency 10/24/2018    Past Surgical History:  Procedure Laterality Date   NO PAST SURGERIES      Family History  Problem Relation Age of Onset   Diabetes Paternal Grandmother    Cancer Maternal Grandmother    Hypercholesterolemia Mother    Hypercholesterolemia Father    Diabetes Brother     Social History   Socioeconomic History   Marital status: Media planner    Spouse name: Renee Pain   Number of children: 2   Years of education: Not on file   Highest education level: Bachelor's degree (e.g., BA, AB, BS)  Occupational History   Occupation: Runner, 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   Lives with the father of her youngest child ( boy ) also has an older girl    Social Determinants of Health   Financial Resource Strain: Low Risk  (06/07/2023)   Overall Financial Resource Strain (CARDIA)    Difficulty of Paying Living Expenses: Not hard at all  Food Insecurity: No Food Insecurity (06/07/2023)   Hunger Vital Sign    Worried About Running Out of Food in the Last Year: Never true    Ran Out of Food in the Last Year: Never true  Transportation Needs: No Transportation Needs (06/07/2023)   PRAPARE - Administrator, Civil Service (Medical): No    Lack of Transportation  (Non-Medical): No  Physical Activity: Insufficiently Active (06/07/2023)   Exercise Vital Sign    Days of Exercise per Week: 4 days    Minutes of Exercise per Session: 30 min  Stress: No Stress Concern  Present (06/07/2023)   [employer] of Occupational Health - Occupational Stress Questionnaire    Feeling of Stress : Not at all  Social Connections: Moderately Isolated (06/07/2023)   Social Connection and Isolation Panel [NHANES]    Frequency of Communication with Friends and Family: More than three times a week    Frequency of Social Gatherings with Friends and Family: More than three times a week    Attends Religious Services: Never    Database administrator or Organizations: No    Attends Banker Meetings: Never    Marital Status: Living with partner  Intimate Partner Violence: Not At Risk (06/07/2023)   Humiliation, Afraid, Rape, and Kick questionnaire    Fear of Current or Ex-Partner: No    Emotionally Abused: No    Physically Abused: No    Sexually Abused: No     Current Outpatient Medications:    Albuterol-Budesonide (AIRSUPRA) 90-80 MCG/ACT AERO, Inhale 2 puffs into the lungs as needed (wheezing)., Disp: 10.7 g, Rfl: 5   ketoconazole (NIZORAL) 2 % cream, Apply 1 Application topically 2 (two) times daily., Disp: 60 g, Rfl: 1  Current Facility-Administered Medications:    levonorgestrel (MIRENA) 20 MCG/24HR IUD, , Intrauterine, Once, Schuman, Christanna R, MD  No Known Allergies   ROS  Constitutional: Negative for fever, positive for weight change.  Respiratory: Negative for cough and shortness of breath.   Cardiovascular: Negative for chest pain or palpitations.  Gastrointestinal: Negative for abdominal pain, no bowel changes.  Musculoskeletal: Negative for gait problem or joint swelling.  Skin: Negative for rash.  Neurological: Negative for dizziness or headache.  No other specific complaints in a complete review of systems (except as listed in HPI  above).   Objective  Vitals:   06/07/23 1446  BP: 122/70  Pulse: 91  Resp: 16  SpO2: 97%  Weight: 201 lb (91.2 kg)  Height: 5\' 3"  (1.6 m)    Body mass index is 35.61 kg/m.  Physical Exam  Constitutional: Patient appears well-developed and well-nourished. No distress.  HENT: Head: Normocephalic and atraumatic. Ears: B TMs ok, no erythema or effusion; Nose: Nose normal. Mouth/Throat: Oropharynx is clear and moist. No oropharyngeal exudate.  Eyes: Conjunctivae and EOM are normal. Pupils are equal, round, and reactive to light. No scleral icterus.  Neck: Normal range of motion. Neck supple. No JVD present. No thyromegaly present.  Cardiovascular: Normal rate, regular rhythm and normal heart sounds.  No murmur heard. No BLE edema. Pulmonary/Chest: Effort normal and breath sounds normal. No respiratory distress. Abdominal: Soft. Bowel sounds are normal, no distension. There is no tenderness. no masses Breast: no lumps or masses, no nipple discharge or rashes FEMALE GENITALIA:  External genitalia normal External urethra normal Vaginal vault normal without discharge or lesions Cervix normal without discharge or lesions - IUD strings not seen -  Bimanual exam normal without masses RECTAL: not done  Musculoskeletal: Normal range of motion, no joint effusions. No gross deformities Neurological: he is alert and oriented to person, place, and time. No cranial nerve deficit. Coordination, balance, strength, speech and gait are normal.  Skin: Skin is warm and dry. No rash noted. No erythema.  Psychiatric: Patient has a normal mood and affect. behavior is normal. Judgment and thought content normal.   Fall Risk:    06/07/2023    2:45 PM 03/01/2023    3:43 PM 07/29/2022    2:41 PM 01/13/2022    2:49 PM 07/08/2021    2:50 PM  Fall  Risk   Falls in the past year? 0 0 0 0 0  Number falls in past yr: 0 0 0 0 0  Injury with Fall? 0 0 0 0 0  Risk for fall due to : No Fall Risks   No Fall Risks    Follow up Falls prevention discussed   Falls evaluation completed Falls evaluation completed     Functional Status Survey: Is the patient deaf or have difficulty hearing?: No Does the patient have difficulty seeing, even when wearing glasses/contacts?: No Does the patient have difficulty concentrating, remembering, or making decisions?: No Does the patient have difficulty walking or climbing stairs?: No Does the patient have difficulty dressing or bathing?: No Does the patient have difficulty doing errands alone such as visiting a doctor's office or shopping?: No   Assessment & Plan  1. Well adult exam  - Cytology - PAP - Lipid panel - CBC with Differential/Platelet - COMPLETE METABOLIC PANEL WITH GFR - Hemoglobin A1c - VITAMIN D 25 Hydroxy (Vit-D Deficiency, Fractures)  2. Cervical cancer screening  - Cytology - PAP  3. Dyslipidemia  - Lipid panel  4. Family history of diabetes mellitus  - Hemoglobin A1c  5. Vitamin D deficiency  - VITAMIN D 25 Hydroxy (Vit-D Deficiency, Fractures)  6. Weight gain  - TSH    -USPSTF grade A and B recommendations reviewed with patient; age-appropriate recommendations, preventive care, screening tests, etc discussed and encouraged; healthy living encouraged; see AVS for patient education given to patient -Discussed importance of 150 minutes of physical activity weekly, eat two servings of fish weekly, eat one serving of tree nuts ( cashews, pistachios, pecans, almonds.Marland Kitchen) every other day, eat 6 servings of fruit/vegetables daily and drink plenty of water and avoid sweet beverages.   -Reviewed Health Maintenance: Yes.

## 2023-06-08 ENCOUNTER — Telehealth: Payer: Self-pay | Admitting: Family Medicine

## 2023-06-08 LAB — CBC WITH DIFFERENTIAL/PLATELET
Absolute Lymphocytes: 4182 {cells}/uL — ABNORMAL HIGH (ref 850–3900)
Absolute Monocytes: 558 {cells}/uL (ref 200–950)
Basophils Absolute: 41 {cells}/uL (ref 0–200)
Basophils Relative: 0.5 %
Eosinophils Absolute: 107 {cells}/uL (ref 15–500)
Eosinophils Relative: 1.3 %
HCT: 41.4 % (ref 35.0–45.0)
Hemoglobin: 13.5 g/dL (ref 11.7–15.5)
MCH: 30.6 pg (ref 27.0–33.0)
MCHC: 32.6 g/dL (ref 32.0–36.0)
MCV: 93.9 fL (ref 80.0–100.0)
MPV: 10.2 fL (ref 7.5–12.5)
Monocytes Relative: 6.8 %
Neutro Abs: 3313 {cells}/uL (ref 1500–7800)
Neutrophils Relative %: 40.4 %
Platelets: 243 10*3/uL (ref 140–400)
RBC: 4.41 10*6/uL (ref 3.80–5.10)
RDW: 11.8 % (ref 11.0–15.0)
Total Lymphocyte: 51 %
WBC: 8.2 10*3/uL (ref 3.8–10.8)

## 2023-06-08 LAB — COMPLETE METABOLIC PANEL WITH GFR
AG Ratio: 1.7 (calc) (ref 1.0–2.5)
ALT: 29 U/L (ref 6–29)
AST: 51 U/L — ABNORMAL HIGH (ref 10–30)
Albumin: 4.4 g/dL (ref 3.6–5.1)
Alkaline phosphatase (APISO): 72 U/L (ref 31–125)
BUN: 16 mg/dL (ref 7–25)
CO2: 30 mmol/L (ref 20–32)
Calcium: 9.1 mg/dL (ref 8.6–10.2)
Chloride: 102 mmol/L (ref 98–110)
Creat: 0.83 mg/dL (ref 0.50–0.97)
Globulin: 2.6 g/dL (ref 1.9–3.7)
Glucose, Bld: 83 mg/dL (ref 65–99)
Potassium: 4 mmol/L (ref 3.5–5.3)
Sodium: 138 mmol/L (ref 135–146)
Total Bilirubin: 0.3 mg/dL (ref 0.2–1.2)
Total Protein: 7 g/dL (ref 6.1–8.1)
eGFR: 95 mL/min/{1.73_m2} (ref >=60)

## 2023-06-08 LAB — HEMOGLOBIN A1C
Hgb A1c MFr Bld: 5.5 %{Hb} (ref <5.7)
Mean Plasma Glucose: 111 mg/dL
eAG (mmol/L): 6.2 mmol/L

## 2023-06-08 LAB — LIPID PANEL
Cholesterol: 238 mg/dL — ABNORMAL HIGH (ref <200)
HDL: 46 mg/dL — ABNORMAL LOW (ref >=50)
LDL Cholesterol (Calc): 146 mg/dL — ABNORMAL HIGH
Non-HDL Cholesterol (Calc): 192 mg/dL — ABNORMAL HIGH (ref <130)
Total CHOL/HDL Ratio: 5.2 (calc) — ABNORMAL HIGH (ref <5.0)
Triglycerides: 279 mg/dL — ABNORMAL HIGH (ref <150)

## 2023-06-08 LAB — VITAMIN D 25 HYDROXY (VIT D DEFICIENCY, FRACTURES): Vit D, 25-Hydroxy: 14 ng/mL — ABNORMAL LOW (ref 30–100)

## 2023-06-08 LAB — TSH: TSH: 1.16 m[IU]/L

## 2023-06-08 NOTE — Telephone Encounter (Signed)
Pt called in to get lab results, not notes showing yet. I let patient know she will be called when they are in.

## 2023-06-09 ENCOUNTER — Encounter: Payer: Self-pay | Admitting: Family Medicine

## 2023-06-14 LAB — CYTOLOGY - PAP
Chlamydia: NEGATIVE
Comment: NEGATIVE
Comment: NEGATIVE
Comment: NORMAL
Diagnosis: NEGATIVE
Diagnosis: REACTIVE
High risk HPV: NEGATIVE
Neisseria Gonorrhea: NEGATIVE

## 2023-08-31 ENCOUNTER — Encounter: Payer: Self-pay | Admitting: Emergency Medicine

## 2023-08-31 ENCOUNTER — Ambulatory Visit
Admission: EM | Admit: 2023-08-31 | Discharge: 2023-08-31 | Disposition: A | Discharge status: Home / Self Care | Payer: BC Managed Care – PPO | Attending: Nurse Practitioner | Admitting: Nurse Practitioner

## 2023-08-31 DIAGNOSIS — R062 Wheezing: Secondary | ICD-10-CM

## 2023-08-31 DIAGNOSIS — H6991 Unspecified Eustachian tube disorder, right ear: Secondary | ICD-10-CM

## 2023-08-31 DIAGNOSIS — J029 Acute pharyngitis, unspecified: Secondary | ICD-10-CM

## 2023-08-31 DIAGNOSIS — Z8709 Personal history of other diseases of the respiratory system: Secondary | ICD-10-CM

## 2023-08-31 LAB — POCT RAPID STREP A (OFFICE): Rapid Strep A Screen: NEGATIVE

## 2023-08-31 MED ORDER — FLUTICASONE PROPIONATE 50 MCG/ACT NA SUSP: 2.0000 | Freq: Every day | NASAL | 0 refills | Status: AC

## 2023-08-31 MED ORDER — LIDOCAINE VISCOUS HCL 2 % MT SOLN: OROMUCOSAL | 0 refills | Status: DC

## 2023-08-31 MED ORDER — PREDNISONE 20 MG PO TABS: 40.0000 mg | ORAL_TABLET | Freq: Every day | ORAL | 0 refills | Status: AC

## 2023-08-31 NOTE — ED Triage Notes (Signed)
Right ear pain and sore throat since yesterday.

## 2023-08-31 NOTE — ED Provider Notes (Signed)
[facility]    CSN: [id number] Arrival date & time: 08/31/23  1825      History   Chief Complaint No chief complaint on file.   HPI ALAISHA Clay is a 33 y.o. female.   The history is provided by the patient.   Patient presents for complaints of sore throat, and right ear pain that started over the past 24 hours.  Patient denies fever, chills, headache, ear drainage, cough wheezing, difficulty breathing, chest pain, abdominal pain, nausea, vomiting, diarrhea, or rash.  Patient reports that she does have history of asthma.  Reports that her asthma is well-controlled, but does have an inhaler as needed.  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  She has not taken any medication for her symptoms. Past Medical History:  Diagnosis Date   Asthma    Mild   History of abnormal cervical Pap smear 2012, 2013, 2016   Hypercholesteremia     Patient Active Problem List   Diagnosis Date Noted   Atypical squamous cell changes of undetermined significance (ASCUS) on cervical cytology with negative high risk human papilloma virus (HPV) test result 01/10/2021   History of asthma 10/24/2018   Pure hypercholesterolemia 10/24/2018   Obesity (BMI 30.0-34.9) 10/24/2018   Vitamin D deficiency 10/24/2018    Past Surgical History:  Procedure Laterality Date   NO PAST SURGERIES      OB History     Gravida  3   Para  2   Term  2   Preterm      AB  1   Living  2      SAB  1   IAB      Ectopic      Multiple  0   Live Births  2            Home Medications    Prior to Admission medications   Medication Sig Start Date End Date Taking? Authorizing Provider  fluticasone (FLONASE) 50 MCG/ACT nasal spray Place 2 sprays into both nostrils daily. 08/31/23  Yes Leath-Warren, Sadie Haber, NP  lidocaine (XYLOCAINE) 2 % solution Gargle and spit 5mL every 6 hours as needed for throat pain. 08/31/23  Yes Leath-Warren,  Sadie Haber, NP  predniSONE (DELTASONE) 20 MG tablet Take 2 tablets (40 mg total) by mouth daily with breakfast for 5 days. 08/31/23 09/05/23 Yes Leath-Warren, Sadie Haber, NP  Albuterol-Budesonide (AIRSUPRA) 90-80 MCG/ACT AERO Inhale 2 puffs into the lungs as needed (wheezing). 03/01/23   Berniece Salines, FNP  ketoconazole (NIZORAL) 2 % cream Apply 1 Application topically 2 (two) times daily. 03/01/23   Berniece Salines, FNP    Family History Family History  Problem Relation Age of Onset   Diabetes Paternal Grandmother    Cancer Maternal Grandmother    Hypercholesterolemia Mother    Hypercholesterolemia Father    Diabetes Brother     Social History Social History   Tobacco Use   Smoking status: Never   Smokeless tobacco: Never  Vaping Use   Vaping status: Never Used  Substance Use Topics   Alcohol use: No   Drug use: No     Allergies   Patient has no known allergies.   Review of Systems Review of Systems Per HPI  Physical Exam Triage Vital Signs ED Triage Vitals [08/31/23 1834]  Encounter Vitals Group     BP 132/87     Systolic BP Percentile  Diastolic BP Percentile      Pulse Rate (!) 101     Resp 18     Temp 98 F (36.7 C)     Temp Source Oral     SpO2 98 %     Weight      Height      Head Circumference      Peak Flow      Pain Score 6     Pain Loc      Pain Education      Exclude from Growth Chart    No data found.  Updated Vital Signs BP 132/87 (BP Location: Right Arm)   Pulse (!) 101   Temp 98 F (36.7 C) (Oral)   Resp 18   SpO2 98%   Visual Acuity Right Eye Distance:   Left Eye Distance:   Bilateral Distance:    Right Eye Near:   Left Eye Near:    Bilateral Near:     Physical Exam Vitals and nursing note reviewed.  Constitutional:      General: She is not in acute distress.    Appearance: Normal appearance.  HENT:     Head: Normocephalic.     Right Ear: Ear canal and external ear normal. A middle ear effusion is present.      Left Ear: Tympanic membrane, ear canal and external ear normal.     Ears:     Comments: Cobblestoning present to posterior oropharynx  Eyes:     Extraocular Movements: Extraocular movements intact.     Pupils: Pupils are equal, round, and reactive to light.  Cardiovascular:     Rate and Rhythm: Normal rate and regular rhythm.     Pulses: Normal pulses.     Heart sounds: Normal heart sounds.  Pulmonary:     Effort: Pulmonary effort is normal. No respiratory distress.     Breath sounds: No stridor. Wheezing (Right lower lobe) present. No rhonchi or rales.  Abdominal:     General: Bowel sounds are normal.     Palpations: Abdomen is soft.     Tenderness: There is no abdominal tenderness.  Musculoskeletal:     Cervical back: Normal range of motion.  Lymphadenopathy:     Cervical: No cervical adenopathy.  Neurological:     Mental Status: She is alert.      UC Treatments / Results  Labs (all labs ordered are listed, but only abnormal results are displayed) Labs Reviewed  POCT RAPID STREP A (OFFICE) - Normal  CULTURE, GROUP A STREP [facility])    EKG   Radiology No results found.  Procedures Procedures (including critical care time)  Medications Ordered in UC Medications - No data to display  Initial Impression / Assessment and Plan / UC Course  I have reviewed the triage vital signs and the nursing notes.  Pertinent labs & imaging results that were available during my care of the patient were reviewed by me and considered in my medical decision making (see chart for details).  On exam, expiratory wheezing noted in the right lower lobe.  Room air sats at 98%.  There is mild concern for possible asthma exacerbation.  Patient also has right middle ear effusion which is most likely causing her ear pain and pressure.  The rapid strep test was negative, throat culture is pending.  Will provide symptomatic treatment with prednisone 40 mg for the next 5 days to help with possible  asthma exacerbation, and right middle ear effusion.  Fluticasone  50 mcg nasal spray for right middle ear effusion and viscous lidocaine 2% for patient to gargle and spit for throat pain or discomfort.  Supportive care recommendations were provided and discussed with the patient to include fluids, rest, over-the-counter analgesics, and warm salt water gargles.  Discussed indications with the patient regarding follow-up.  Patient was in agreement with this plan of care and verbalizes understanding.  All questions were answered.  Patient stable for discharge.  Final Clinical Impressions(s) / UC Diagnoses   Final diagnoses:  Dysfunction of Eustachian tube, right  Sore throat     Discharge Instructions      The rapid strep test was negative. A throat culture is pending, you will be contacted if the result of the culture is positive. Take medication as prescribed. Start the allergy medication you have at home.  May take OTC Tylenol or Ibuprofen as needed for pain or discomfort. Warm saltwater gargles 3 to 4 times daily as needed for throat pain or discomfort. Warm compresses to the affected ear as needed for pain or discomfort. If symptoms fail to improve, you may follow-up in this clinic or with your PCP for further evaluation. Follow-up as needed.      ED Prescriptions     Medication Sig Dispense Auth. Provider   predniSONE (DELTASONE) 20 MG tablet Take 2 tablets (40 mg total) by mouth daily with breakfast for 5 days. 10 tablet Leath-Warren, Sadie Haber, NP   fluticasone (FLONASE) 50 MCG/ACT nasal spray Place 2 sprays into both nostrils daily. 16 g Leath-Warren, Sadie Haber, NP   lidocaine (XYLOCAINE) 2 % solution Gargle and spit 5mL every 6 hours as needed for throat pain. 100 mL Leath-Warren, Sadie Haber, NP      PDMP not reviewed this encounter.   Abran Cantor, NP 08/31/23 Ernestina [place]

## 2023-08-31 NOTE — Discharge Instructions (Addendum)
The rapid strep test was negative. A throat culture is pending, you will be contacted if the result of the culture is positive. Take medication as prescribed. Start the allergy medication you have at home.  May take OTC Tylenol or Ibuprofen as needed for pain or discomfort. Warm saltwater gargles 3 to 4 times daily as needed for throat pain or discomfort. Warm compresses to the affected ear as needed for pain or discomfort. If symptoms fail to improve, you may follow-up in this clinic or with your PCP for further evaluation. Follow-up as needed.

## 2023-09-04 LAB — CULTURE, GROUP A STREP (THRC)

## 2023-12-17 ENCOUNTER — Encounter: Payer: Self-pay | Admitting: Family Medicine

## 2023-12-17 ENCOUNTER — Ambulatory Visit: Admitting: Family Medicine

## 2023-12-17 ENCOUNTER — Other Ambulatory Visit (HOSPITAL_COMMUNITY)
Admission: RE | Admit: 2023-12-17 | Discharge: 2023-12-17 | Disposition: A | Discharge status: Home / Self Care | Source: Ambulatory Visit | Attending: Family Medicine | Admitting: Family Medicine

## 2023-12-17 ENCOUNTER — Ambulatory Visit (INDEPENDENT_AMBULATORY_CARE_PROVIDER_SITE_OTHER): Admitting: Family Medicine

## 2023-12-17 VITALS — BP 128/76 | HR 98 | Temp 98.1°F | Resp 16 | Ht 63.0 in | Wt 197.9 lb

## 2023-12-17 DIAGNOSIS — N898 Other specified noninflammatory disorders of vagina: Secondary | ICD-10-CM

## 2023-12-17 DIAGNOSIS — Z113 Encounter for screening for infections with a predominantly sexual mode of transmission: Secondary | ICD-10-CM | POA: Diagnosis not present

## 2023-12-17 DIAGNOSIS — N920 Excessive and frequent menstruation with regular cycle: Secondary | ICD-10-CM | POA: Diagnosis not present

## 2023-12-17 LAB — POCT URINE PREGNANCY: Preg Test, Ur: NEGATIVE

## 2023-12-17 NOTE — Progress Notes (Signed)
 Name: Sierra Clay   MRN: [id number]    DOB: 09-24-1990   Date:12/17/2023       Progress Note  Subjective  Chief Complaint  Chief Complaint  Patient presents with   Vaginal Bleeding    Spotting has IUD    Discussed the use of AI scribe software for clinical note transcription with the patient, who gave verbal consent to proceed.  History of Present Illness Sierra LINNEBUR is a 33 year old female who presents with spotting while using a Mirena  IUD.  She has been experiencing spotting, which is a new symptom for her. She has been using a Mirena  IUD for nearly six years, placed shortly after the birth of her son. The spotting is brown in color, not heavy, and is not associated with any pain or discomfort. She first noticed the spotting yesterday and has not experienced any vaginal discharge.  Her last Pap smear in November 2024 was normal, with negative results for HPV, gonorrhea, and chlamydia. A thyroid  check at the same time was also normal.  She is sexually active and is concerned about the possibility of pregnancy, although she does not wish to become pregnant. She mentions recent stress due to her engagement, which occurred yesterday.  No pain or discomfort associated with the spotting and no vaginal discharge.    Patient Active Problem List   Diagnosis Date Noted   History of asthma 10/24/2018   Pure hypercholesterolemia 10/24/2018   Obesity (BMI 30.0-34.9) 10/24/2018   Vitamin D  deficiency 10/24/2018    Social History   Tobacco Use   Smoking status: Never   Smokeless tobacco: Never  Substance Use Topics   Alcohol use: No     Current Outpatient Medications:    Albuterol-Budesonide (AIRSUPRA ) 90-80 MCG/ACT AERO, Inhale 2 puffs into the lungs as needed (wheezing)., Disp: 10.7 g, Rfl: 5   ketoconazole  (NIZORAL ) 2 % cream, Apply 1 Application topically 2 (two) times daily., Disp: 60 g, Rfl: 1   fluticasone  (FLONASE ) 50 MCG/ACT nasal spray, Place 2 sprays into both  nostrils daily. (Patient not taking: Reported on 12/17/2023), Disp: 16 g, Rfl: 0  Current Facility-Administered Medications:    levonorgestrel  (MIRENA ) 20 MCG/24HR IUD, , Intrauterine, Once, Schuman, Christanna R, MD  No Known Allergies  ROS  Ten systems reviewed and is negative except as mentioned in HPI    Objective  Vitals:   12/17/23 1004  BP: 128/76  Pulse: 98  Resp: 16  Temp: 98.1 F (36.7 C)  TempSrc: Oral  SpO2: 99%  Weight: 197 lb 14.4 oz (89.8 kg)  Height: 5\' 3"  (1.6 m)    Body mass index is 35.06 kg/m.   Physical Exam CONSTITUTIONAL: Patient appears well-developed and well-nourished. No distress. HEENT: Head atraumatic, normocephalic, neck supple. CARDIOVASCULAR: Normal rate, regular rhythm and normal heart sounds. No murmur heard. No BLE edema. PULMONARY: Effort normal and breath sounds normal. No respiratory distress. ABDOMINAL: There is no tenderness or distention. PSYCHIATRIC: Patient has a normal mood and affect. Behavior is normal. Judgment and thought content normal. GENITOURINARY: IUD string not visible - try feeling the strings since she states it was short but nothing seems to be in the cervical os    Assessment & Plan Spotting  Spotting likely due to nearing end of Mirena  IUD's efficacy period. IUD strings very short , complicating manual confirmation, not felt during exam .Recent Pap smear, STI screenings negative, thyroid  function normal. - Pregnancy test negative  - Advise using condoms until IUD placement  confirmed. - Recommend early IUD replacement if spotting continues if still in place - Patient really worried about getting pregnant, gyn cannot do Pelvic US  in office, we will place order to get it done at [facility]

## 2023-12-20 ENCOUNTER — Ambulatory Visit: Admitting: Family Medicine

## 2023-12-20 ENCOUNTER — Ambulatory Visit: Payer: Self-pay

## 2023-12-20 ENCOUNTER — Encounter: Payer: Self-pay | Admitting: Family Medicine

## 2023-12-20 VITALS — BP 110/70 | HR 98 | Temp 97.9°F | Resp 16 | Ht 63.0 in | Wt 200.0 lb

## 2023-12-20 DIAGNOSIS — N760 Acute vaginitis: Secondary | ICD-10-CM

## 2023-12-20 DIAGNOSIS — R1032 Left lower quadrant pain: Secondary | ICD-10-CM

## 2023-12-20 DIAGNOSIS — T8332XA Displacement of intrauterine contraceptive device, initial encounter: Secondary | ICD-10-CM | POA: Diagnosis not present

## 2023-12-20 DIAGNOSIS — B9689 Other specified bacterial agents as the cause of diseases classified elsewhere: Secondary | ICD-10-CM

## 2023-12-20 DIAGNOSIS — R1031 Right lower quadrant pain: Secondary | ICD-10-CM

## 2023-12-20 DIAGNOSIS — R1030 Lower abdominal pain, unspecified: Secondary | ICD-10-CM

## 2023-12-20 LAB — CERVICOVAGINAL ANCILLARY ONLY
Bacterial Vaginitis (gardnerella): POSITIVE — AB
Chlamydia: NEGATIVE
Comment: NEGATIVE
Comment: NEGATIVE
Comment: NEGATIVE
Comment: NORMAL
Neisseria Gonorrhea: NEGATIVE
Trichomonas: NEGATIVE

## 2023-12-20 LAB — POCT URINALYSIS DIPSTICK
Bilirubin, UA: NEGATIVE
Glucose, UA: NEGATIVE
Ketones, UA: NEGATIVE
Leukocytes, UA: NEGATIVE
Nitrite, UA: NEGATIVE
Odor: NORMAL
Protein, UA: NEGATIVE
Spec Grav, UA: 1.015 (ref 1.010–1.025)
Urobilinogen, UA: 0.2 U/dL — AB
pH, UA: 6.5 (ref 5.0–8.0)

## 2023-12-20 LAB — POCT URINE PREGNANCY: Preg Test, Ur: NEGATIVE

## 2023-12-20 MED ORDER — METRONIDAZOLE 500 MG PO TABS: 500.0000 mg | ORAL_TABLET | Freq: Three times a day (TID) | ORAL | 0 refills | Status: AC

## 2023-12-20 MED ORDER — HYOSCYAMINE SULFATE 0.125 MG SL SUBL: 0.1250 mg | SUBLINGUAL_TABLET | SUBLINGUAL | 0 refills | Status: DC | PRN

## 2023-12-20 NOTE — Telephone Encounter (Signed)
 Copied from CRM [id number]. Topic: Clinical - Red Word Triage >> Dec 20, 2023 11:18 AM Leory Rands wrote: Red Word that prompted transfer to Nurse Triage: Patient is calling to report pain that starting today all over her body pain 10/10. Patient came to see Dr. Ava Lei on 12/17/23 for spotting during that time she was unable to find IUD.    Chief Complaint: Pain right lower abdominal pain after bowel movement.Seen in office 12/17/23, unable to locate IUD. Symptoms: above Frequency: today Pertinent Negatives: Patient denies  Disposition: [] ED /[] Urgent Care (no appt availability in office) / [x] Appointment(In office/virtual)/ []  [facility]/ [] Home Care/ [] Refused Recommended Disposition /[] [facility]/ []  Follow-up with PCP Additional Notes: Will go to ED for worsening of symptoms.  Reason for Disposition  [1] MILD-MODERATE pain AND [2] constant AND [3] present > 2 hours  Answer Assessment - Initial Assessment Questions 1. LOCATION: "Where does it hurt?"      Lower 2. RADIATION: "Does the pain shoot anywhere else?" (e.g., chest, back)     No 3. ONSET: "When did the pain begin?" (e.g., minutes, hours or days ago)      Today 4. SUDDEN: "Gradual or sudden onset?"     Sudden 5. PATTERN "Does the pain come and go, or is it constant?"    - If it comes and goes: "How long does it last?" "Do you have pain now?"     (Note: Comes and goes means the pain is intermittent. It goes away completely between bouts.)    - If constant: "Is it getting better, staying the same, or getting worse?"      (Note: Constant means the pain never goes away completely; most serious pain is constant and gets worse.)      constant 6. SEVERITY: "How bad is the pain?"  (e.g., Scale 1-10; mild, moderate, or severe)    - MILD (1-3): Doesn't interfere with normal activities, abdomen soft and not tender to touch.     - MODERATE (4-7): Interferes with normal activities or awakens from sleep, abdomen tender  to touch.     - SEVERE (8-10): Excruciating pain, doubled over, unable to do any normal activities.       10 7. RECURRENT SYMPTOM: "Have you ever had this type of stomach pain before?" If Yes, ask: "When was the last time?" and "What happened that time?"      no 8. CAUSE: "What do you think is causing the stomach pain?"     unsure 9. RELIEVING/AGGRAVATING FACTORS: "What makes it better or worse?" (e.g., antacids, bending or twisting motion, bowel movement)     no 10. OTHER SYMPTOMS: "Do you have any other symptoms?" (e.g., back pain, diarrhea, fever, urination pain, vomiting)       no 11. PREGNANCY: "Is there any chance you are pregnant?" "When was your last menstrual period?"       no  Protocols used: Abdominal Pain - [facility]

## 2023-12-20 NOTE — Progress Notes (Signed)
 Name: Sierra Clay   MRN: [id number]    DOB: [birth year]   Date:12/20/2023       Progress Note  Subjective  Chief Complaint  Chief Complaint  Patient presents with   Abdominal Pain    Started today    Discussed the use of AI scribe software for clinical note transcription with the patient, who gave verbal consent to proceed.  History of Present Illness Sierra Clay is a 33 year old female who presents with abdominal pain and concerns about her IUD.  She has been experiencing spotting since Friday, which prompted her visit to the clinic. During her previous visit, her IUD could not be located, and a pelvic ultrasound is scheduled for Wednesday.  Today, she called the clinic due to new onset abdominal pain. The pain is described as severe cramping in the right lower quadrant, which began after hearing a 'popping' sound in the toilet bowl  while urinating. 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 Movement seems to alleviate the pain, while standing still or sitting exacerbates it.  No nausea, vomiting, burning sensation during urination, vaginal discharge, vaginal bleeding, hematuria or diarrhea. She has no history of kidney stones and no known family history of kidney stones.  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    Patient Active Problem List   Diagnosis Date Noted   History of asthma 10/24/2018   Pure hypercholesterolemia 10/24/2018   Obesity (BMI 30.0-34.9) 10/24/2018   Vitamin D  deficiency 10/24/2018    Social History   Tobacco Use   Smoking status: Never   Smokeless tobacco: Never  Substance Use Topics   Alcohol use: No     Current Outpatient Medications:    Albuterol-Budesonide (AIRSUPRA ) 90-80 MCG/ACT AERO, Inhale 2 puffs into the lungs as needed (wheezing)., Disp: 10.7 g, Rfl: 5   ketoconazole  (NIZORAL ) 2 % cream, Apply 1 Application topically 2 (two) times daily.,  Disp: 60 g, Rfl: 1   fluticasone  (FLONASE ) 50 MCG/ACT nasal spray, Place 2 sprays into both nostrils daily. (Patient not taking: Reported on 12/20/2023), Disp: 16 g, Rfl: 0  Current Facility-Administered Medications:    levonorgestrel  (MIRENA ) 20 MCG/24HR IUD, , Intrauterine, Once, Schuman, Christanna R, MD  No Known Allergies  ROS  Ten systems reviewed and is negative except as mentioned in HPI    Objective  Vitals:   12/20/23 1407  BP: 110/70  Pulse: 98  Resp: 16  Temp: 97.9 F (36.6 C)  TempSrc: Oral  SpO2: 99%  Weight: 200 lb (90.7 kg)  Height: 5\' 3"  (1.6 m)    Body mass index is 35.43 kg/m.   Physical Exam CONSTITUTIONAL: Patient appears well-developed and well-nourished. No distress. HEENT: Head atraumatic, normocephalic, neck supple. CARDIOVASCULAR: Normal rate, regular rhythm and normal heart sounds. No murmur heard. No BLE edema. PULMONARY: Effort normal and breath sounds normal. No respiratory distress. ABDOMINAL: Left abdominal tenderness on palpation and right lower quadrant tenderness on palpation, voluntary guarding, some pain during percussion of right flank  PSYCHIATRIC: Patient has a normal mood and affect. Behavior is normal. Judgment and thought content normal.  Recent Results (from the past 2160 hours)  POCT urine pregnancy     Status: None   Collection Time: 12/17/23 10:39 AM  Result Value Ref Range   Preg Test, Ur Negative Negative    Assessment & Plan Abdominal pain, right lower quadrant Acute severe  cramping pain in the right lower quadrant but also on LLQ. Differential includes nephrolithiasis, appendicitis, ovarian cyst, or IUD complications. Popping sensation during urination suggests nephrolithiasis. - Order stat CT scan to evaluate for nephrolithiasis, appendicitis, ovarian cyst, or IUD complications. - Perform urine dipstick test to check for hematuria. - Repeat pregnancy test to rule out pregnancy.  Missing IUD IUD not palpable,  concerns about displacement or expulsion. - Proceed with scheduled pelvic ultrasound to locate the IUD  BV Sending metronidazole  after CT is done

## 2023-12-21 ENCOUNTER — Other Ambulatory Visit (HOSPITAL_COMMUNITY): Payer: Self-pay

## 2023-12-21 ENCOUNTER — Ambulatory Visit
Admission: RE | Admit: 2023-12-21 | Discharge: 2023-12-21 | Disposition: A | Discharge status: Home / Self Care | Source: Ambulatory Visit | Attending: Family Medicine | Admitting: Family Medicine

## 2023-12-21 ENCOUNTER — Ambulatory Visit: Payer: Self-pay | Admitting: Family Medicine

## 2023-12-21 ENCOUNTER — Encounter: Payer: Self-pay | Admitting: Family Medicine

## 2023-12-21 ENCOUNTER — Telehealth: Payer: Self-pay | Admitting: Pharmacy Technician

## 2023-12-21 DIAGNOSIS — R1030 Lower abdominal pain, unspecified: Secondary | ICD-10-CM

## 2023-12-21 DIAGNOSIS — R1032 Left lower quadrant pain: Secondary | ICD-10-CM | POA: Diagnosis not present

## 2023-12-21 NOTE — Telephone Encounter (Signed)
 Pharmacy Patient Advocate Encounter   Received notification from CoverMyMeds that prior authorization for Hyoscyamine  Sulfate 0.125MG  sublingual tablets is required/requested.   Insurance verification completed.   The patient is insured through [employer] .   Per test claim:   Unfortunately they will not cover with no exceptions.

## 2023-12-22 ENCOUNTER — Ambulatory Visit
Admission: RE | Admit: 2023-12-22 | Discharge: 2023-12-22 | Disposition: A | Discharge status: Home / Self Care | Source: Ambulatory Visit | Attending: Family Medicine | Admitting: Family Medicine

## 2023-12-22 DIAGNOSIS — N939 Abnormal uterine and vaginal bleeding, unspecified: Secondary | ICD-10-CM | POA: Diagnosis not present

## 2023-12-22 DIAGNOSIS — N920 Excessive and frequent menstruation with regular cycle: Secondary | ICD-10-CM | POA: Diagnosis not present

## 2023-12-31 ENCOUNTER — Ambulatory Visit: Payer: Self-pay | Admitting: Family Medicine

## 2023-12-31 NOTE — Progress Notes (Signed)
    GYNECOLOGY OFFICE ENCOUNTER NOTE  History:  33 y.o. [id number] here today for today for IUD string check; Mirena   IUD was placed  02/14/2018. Notes that she has not been able to feel the strings recently. She also has been having some vaginal spotting and pain. She has had abdominal pain x 3 weeks, that comes and goes. Pain is sharp, shooting and pinching. She was evaluated by her PCP she ordered an ultrasound and and several other test. IUD was located and in the correct position. She would like to have IUD removed and start OCP today.  The following portions of the patient's history were reviewed and updated as appropriate: allergies, current medications, past family history, past medical history, past social history, past surgical history and problem list. Last pap smear on 06/07/2023 was normal, negative HRHPV.   Review of Systems:  Pertinent items are noted in HPI.  Objective:  Physical Exam Blood pressure 131/85, pulse 82, resp. rate 16, height 5\' 3"  (1.6 m), weight 202 lb 8 oz (91.9 kg). CONSTITUTIONAL: Well-developed, well-nourished female in no acute distress.  NEUROLOGIC: Alert and oriented to person, place, and time. Normal reflexes, muscle tone coordination.  ABDOMEN: Soft, no distention noted, tender bilaterally but more on the left PELVIC: Normal appearing external genitalia; normal appearing vaginal mucosa and cervix.  IUD strings visualized using a string finder, about 1 cm in length outside cervix. Done in the presence of a chaperone.  EXTREMITIES: Non-tender, no edema or cyanosis  IUD removed using ring forceps and shown to patient.   Assessment & Plan:  1.Pelvic pain - Reviewed that pain may be from IUD but not likely due to location and positioning of device. Removed per patient request.  2. Contraception- Will start on OCPs.    Donato Fu, CNM [facility]

## 2024-01-03 ENCOUNTER — Ambulatory Visit: Admitting: Certified Nurse Midwife

## 2024-01-03 ENCOUNTER — Encounter: Payer: Self-pay | Admitting: Certified Nurse Midwife

## 2024-01-03 VITALS — BP 131/85 | HR 82 | Resp 16 | Ht 63.0 in | Wt 202.5 lb

## 2024-01-03 DIAGNOSIS — Z30432 Encounter for removal of intrauterine contraceptive device: Secondary | ICD-10-CM | POA: Diagnosis not present

## 2024-01-03 DIAGNOSIS — R102 Pelvic and perineal pain: Secondary | ICD-10-CM

## 2024-01-03 MED ORDER — LEVONORGEST-ETH ESTRAD 91-DAY 0.15-0.03 &0.01 MG PO TABS: 1.0000 | ORAL_TABLET | Freq: Every day | ORAL | 4 refills | Status: AC

## 2024-01-06 NOTE — Addendum Note (Signed)
 Addended by: Donato Fu on: 01/06/2024 03:17 PM   Modules accepted: Level of Service

## 2024-01-11 ENCOUNTER — Encounter: Payer: Self-pay | Admitting: Family Medicine

## 2024-01-11 ENCOUNTER — Other Ambulatory Visit: Payer: Self-pay | Admitting: Family Medicine

## 2024-01-11 MED ORDER — FLUCONAZOLE 150 MG PO TABS: 150.0000 mg | ORAL_TABLET | ORAL | 0 refills | Status: DC

## 2024-03-12 ENCOUNTER — Other Ambulatory Visit: Payer: Self-pay | Admitting: Nurse Practitioner

## 2024-03-12 DIAGNOSIS — J4520 Mild intermittent asthma, uncomplicated: Secondary | ICD-10-CM

## 2024-03-15 NOTE — Telephone Encounter (Signed)
 Requested medication (s) are due for refill today: yes  Requested medication (s) are on the active medication list: yes  Last refill:  03/01/23 10.7 5 RF  Future visit scheduled: yes  Notes to clinic:  med not assigned to a protocol   Requested Prescriptions  Pending Prescriptions Disp Refills   AIRSUPRA  90-80 MCG/ACT AERO [Pharmacy Med Name: Airsupra  90-80 MCG/ACT Inhalation Aerosol] 11 g 0    Sig: INHALE 2 PUFFS AS NEEDED FOR WHEEZING     Off-Protocol Failed - 03/15/2024  3:50 PM      Failed - Medication not assigned to a protocol, review manually.      Failed - Valid encounter within last 12 months    Recent Outpatient Visits           2 months ago Acute right lower quadrant pain   [facility] Glenard Mire, MD   2 months ago Spotting   [facility] Glenard Mire, MD       Future Appointments             In 2 months Glenard, Krichna, MD [facility], [facility]

## 2024-03-21 ENCOUNTER — Other Ambulatory Visit (HOSPITAL_COMMUNITY): Payer: Self-pay

## 2024-03-21 ENCOUNTER — Telehealth: Payer: Self-pay | Admitting: Pharmacy Technician

## 2024-03-21 NOTE — Telephone Encounter (Signed)
 Pharmacy Patient Advocate Encounter   Received notification from CoverMyMeds that prior authorization for Airsupra  90-80MCG/ACT aerosol is required/requested.   Insurance verification completed.   The patient is insured through [facility] .   Per test claim:  BREO ELLIPTA, ADVAIR, or SYMBICORT  is preferred by the insurance.  If suggested medication is appropriate, Please send in a new RX and discontinue this one. If not, please advise as to why it's not appropriate so that we may request a Prior Authorization. Please note, some preferred medications may still require a PA.  If the suggested medications have not been trialed and there are no contraindications to their use, the PA will not be submitted, as it will not be approved.

## 2024-03-23 ENCOUNTER — Other Ambulatory Visit: Payer: Self-pay | Admitting: Family Medicine

## 2024-03-23 MED ORDER — BUDESONIDE-FORMOTEROL FUMARATE 160-4.5 MCG/ACT IN AERO: 2.0000 | INHALATION_SPRAY | Freq: Two times a day (BID) | RESPIRATORY_TRACT | 1 refills | Status: AC

## 2024-06-07 ENCOUNTER — Encounter: Payer: Self-pay | Admitting: Family Medicine

## 2024-07-24 NOTE — Patient Instructions (Signed)

## 2024-07-25 ENCOUNTER — Encounter: Payer: Self-pay | Admitting: Family Medicine

## 2024-07-25 ENCOUNTER — Ambulatory Visit (INDEPENDENT_AMBULATORY_CARE_PROVIDER_SITE_OTHER): Admitting: Family Medicine

## 2024-07-25 VITALS — BP 126/76 | HR 70 | Resp 16 | Ht 63.0 in | Wt 193.3 lb

## 2024-07-25 DIAGNOSIS — Z0001 Encounter for general adult medical examination with abnormal findings: Secondary | ICD-10-CM | POA: Diagnosis not present

## 2024-07-25 DIAGNOSIS — Z0000 Encounter for general adult medical examination without abnormal findings: Secondary | ICD-10-CM

## 2024-07-25 DIAGNOSIS — Z23 Encounter for immunization: Secondary | ICD-10-CM | POA: Diagnosis not present

## 2024-07-25 DIAGNOSIS — E559 Vitamin D deficiency, unspecified: Secondary | ICD-10-CM

## 2024-07-25 DIAGNOSIS — E785 Hyperlipidemia, unspecified: Secondary | ICD-10-CM

## 2024-07-25 DIAGNOSIS — Z131 Encounter for screening for diabetes mellitus: Secondary | ICD-10-CM | POA: Diagnosis not present

## 2024-07-25 DIAGNOSIS — Z130 Encounter for screening for diseases of the blood and blood-forming organs and certain disorders involving the immune mechanism: Secondary | ICD-10-CM

## 2024-07-25 NOTE — Progress Notes (Signed)
 Name: Sierra Clay   MRN: [id number]    DOB: 07/22/1991   Date:07/25/2024       Progress Note  Subjective  Chief Complaint  Chief Complaint  Patient presents with   Annual Exam    HPI  Patient presents for annual CPE.  Diet: eating at home, air fries her food, not eating after 8 pm and making smoothies in the morning Exercise: continue regular physical activity   Last Eye Exam: completed Last Dental Exam: completed  Flowsheet Row Office Visit from 07/25/2024 in [facility]  AUDIT-C Score 0   Depression: Phq 9 is  negative    07/25/2024   10:23 AM 12/20/2023    2:03 PM 12/17/2023   10:02 AM 06/07/2023    2:45 PM 03/01/2023    3:43 PM  Depression screen PHQ 2/9  Decreased Interest 0 1 1 0 0  Down, Depressed, Hopeless 0 1 1 0 0  PHQ - 2 Score 0 2 2 0 0  Altered sleeping  0 0 0   Tired, decreased energy  3 3 0   Change in appetite  0 0 0   Feeling bad or failure about yourself   1 1 0   Trouble concentrating  0 0 0   Moving slowly or fidgety/restless  0 0 0   Suicidal thoughts  0 0 0   PHQ-9 Score  6  6  0    Difficult doing work/chores  Not difficult at all Not difficult at all       Data saved with a previous flowsheet row definition   Hypertension: BP Readings from Last 3 Encounters:  07/25/24 126/76  01/03/24 131/85  12/20/23 110/70   Obesity: Wt Readings from Last 3 Encounters:  07/25/24 193 lb 4.8 oz (87.7 kg)  01/03/24 202 lb 8 oz (91.9 kg)  12/20/23 200 lb (90.7 kg)   BMI Readings from Last 3 Encounters:  07/25/24 34.24 kg/m  01/03/24 35.87 kg/m  12/20/23 35.43 kg/m     Vaccines: reviewed with the patient.   Hep C Screening: completed STD testing and prevention (HIV/chl/gon/syphilis): Not interested Intimate partner violence: negative screen  Sexual History : same partner past 8 years, getting married 04/28/2025 Menstrual History/LMP/Abnormal Bleeding: cycles are regular on ocp Discussed importance of follow up  if any post-menopausal bleeding: not applicable  Incontinence Symptoms: negative for symptoms   Breast cancer:  - Last Mammogram: N/A - BRCA gene screening: N/A  Osteoporosis Prevention : Discussed high calcium and vitamin D  supplementation, weight bearing exercises Bone density :not applicable   Cervical cancer screening: up-to-date  Skin cancer: Discussed monitoring for atypical lesions  Colorectal cancer: N/A   Lung cancer:  Low Dose CT Chest recommended if Age 75-80 years, 20 pack-year currently smoking OR have quit w/in 15years. Patient does not qualify for screen     Advanced Care Planning: A voluntary discussion about advance care planning including the explanation and discussion of advance directives.  Discussed health care proxy and Living will, and the patient was able to identify a health care proxy as significant other .  Patient does not have a living will and power of attorney of health care   Patient Active Problem List   Diagnosis Date Noted   History of asthma 10/24/2018   Pure hypercholesterolemia 10/24/2018   Obesity (BMI 30.0-34.9) 10/24/2018   Vitamin D  deficiency 10/24/2018    Past Surgical History:  Procedure Laterality Date   NO PAST SURGERIES  Family History  Problem Relation Age of Onset   Diabetes Paternal Grandmother    Cancer Maternal Grandmother    Hypercholesterolemia Mother    Hypercholesterolemia Father    Diabetes Brother     Social History   Socioeconomic History   Marital status: Media Planner    Spouse name: Garret Gavel   Number of children: 2   Years of education: Not on file   Highest education level: Bachelor's degree (e.g., BA, AB, BS)  Occupational History   Occupation: runner, 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   Tobacco Use   Smoking status: Never   Smokeless tobacco: Never  Vaping Use   Vaping status: Never Used  Substance and Sexual Activity   Alcohol use: No   Drug use: No   Sexual activity: Yes     Partners: Male    Birth control/protection: Pill  Other Topics Concern   Not on file  Social History Narrative   She has two children, 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   Lives with the father of her youngest child ( boy ) also has an older girl    Social Drivers of Health   Tobacco Use: Low Risk (07/25/2024)   Patient History    Smoking Tobacco Use: Never    Smokeless Tobacco Use: Never    Passive Exposure: Not on file  Financial Resource Strain: Low Risk (07/25/2024)   Overall Financial Resource Strain (CARDIA)    Difficulty of Paying Living Expenses: Not hard at all  Food Insecurity: No Food Insecurity (07/25/2024)   Epic    Worried About Radiation Protection Practitioner of Food in the Last Year: Never true    Ran Out of Food in the Last Year: Never true  Transportation Needs: No Transportation Needs (07/25/2024)   Epic    Lack of Transportation (Medical): No    Lack of Transportation (Non-Medical): No  Physical Activity: Sufficiently Active (07/25/2024)   Exercise Vital Sign    Days of Exercise per Week: 5 days    Minutes of Exercise per Session: 60 min  Stress: No Stress Concern Present (07/25/2024)   [employer] of Occupational Health - Occupational Stress Questionnaire    Feeling of Stress: Not at all  Social Connections: Moderately Isolated (07/25/2024)   Social Connection and Isolation Panel    Frequency of Communication with Friends and Family: More than three times a week    Frequency of Social Gatherings with Friends and Family: More than three times a week    Attends Religious Services: Never    Database Administrator or Organizations: No    Attends Banker Meetings: Never    Marital Status: Living with partner  Intimate Partner Violence: Not At Risk (07/25/2024)   Epic    Fear of Current or Ex-Partner: No    Emotionally Abused: No    Physically Abused: No    Sexually Abused: No  Depression (PHQ2-9): Low Risk (07/25/2024)    Depression (PHQ2-9)    PHQ-2 Score: 0  Alcohol Screen: Low Risk (07/25/2024)   Alcohol Screen    Last Alcohol Screening Score (AUDIT): 0  Housing: Unknown (07/25/2024)   Epic    Unable to Pay for Housing in the Last Year: No    Number of Times Moved in the Last Year: Not on file    Homeless in the Last Year: No  Utilities: Not At Risk (07/25/2024)   Epic    Threatened with  loss of utilities: No  Health Literacy: Adequate Health Literacy (07/25/2024)   B1300 Health Literacy    Frequency of need for help with medical instructions: Never    Current Medications[1]  Allergies[2]   ROS  Constitutional: Negative for fever or weight change.  Respiratory: Negative for cough and shortness of breath.   Cardiovascular: Negative for chest pain or palpitations.  Gastrointestinal: Negative for abdominal pain, no bowel changes.  Musculoskeletal: Negative for gait problem or joint swelling.  Skin: Negative for rash.  Neurological: Negative for dizziness or headache.  No other specific complaints in a complete review of systems (except as listed in HPI above).   Objective  Vitals:   07/25/24 1029  BP: 126/76  Pulse: 70  Resp: 16  SpO2: 97%  Weight: 193 lb 4.8 oz (87.7 kg)  Height: 5' 3 (1.6 m)    Body mass index is 34.24 kg/m.  Physical Exam  Constitutional: Patient appears well-developed and well-nourished. No distress.  HENT: Head: Normocephalic and atraumatic. Ears: B TMs ok, no erythema or effusion; Nose: Nose normal. Mouth/Throat: Oropharynx is clear and moist. No oropharyngeal exudate.  Eyes: Conjunctivae and EOM are normal. Pupils are equal, round, and reactive to light. No scleral icterus.  Neck: Normal range of motion. Neck supple. No JVD present. No thyromegaly present.  Cardiovascular: Normal rate, regular rhythm and normal heart sounds.  No murmur heard. No BLE edema. Pulmonary/Chest: Effort normal and breath sounds normal. No respiratory distress. Abdominal: Soft.  Bowel sounds are normal, no distension. There is no tenderness. no masses Breast: no lumps or masses, no nipple discharge or rashes FEMALE GENITALIA:  Not done  RECTAL: not done  Musculoskeletal: Normal range of motion, no joint effusions. No gross deformities Neurological: he is alert and oriented to person, place, and time. No cranial nerve deficit. Coordination, balance, strength, speech and gait are normal.  Skin: Skin is warm and dry. No rash noted. No erythema.  Psychiatric: Patient has a normal mood and affect. behavior is normal. Judgment and thought content normal.     Assessment & Plan  1. Well adult exam (Primary)  - CBC with Differential/Platelet - Comprehensive metabolic panel with GFR - TSH - VITAMIN D  25 Hydroxy (Vit-D Deficiency, Fractures) - Lipid panel - Hemoglobin A1c  2. Need for influenza vaccination  - Flu vaccine trivalent PF, 6mos and older(Flulaval,Afluria,Fluarix,Fluzone)  3. Dyslipidemia  - Lipid panel  4. Vitamin D  deficiency  - VITAMIN D  25 Hydroxy (Vit-D Deficiency, Fractures)  5. Diabetes mellitus screening  - Hemoglobin A1c  6. Screening for deficiency anemia  - CBC with Differential/Platelet   -USPSTF grade A and B recommendations reviewed with patient; age-appropriate recommendations, preventive care, screening tests, etc discussed and encouraged; healthy living encouraged; see AVS for patient education given to patient -Discussed importance of 150 minutes of physical activity weekly, eat two servings of fish weekly, eat one serving of tree nuts ( cashews, pistachios, pecans, almonds.SABRA) every other day, eat 6 servings of fruit/vegetables daily and drink plenty of water and avoid sweet beverages.   -Reviewed Health Maintenance: Yes.       [1]  Current Outpatient Medications:    budesonide -formoterol  (SYMBICORT ) 160-4.5 MCG/ACT inhaler, Inhale 2 puffs into the lungs 2 (two) times daily., Disp: 1 each, Rfl: 1   fluticasone  (FLONASE )  50 MCG/ACT nasal spray, Place 2 sprays into both nostrils daily., Disp: 16 g, Rfl: 0   Levonorgestrel -Ethinyl Estradiol (AMETHIA) 0.15-0.03 &0.01 MG tablet, Take 1 tablet by mouth at bedtime., Disp:  84 tablet, Rfl: 4 [2] No Known Allergies

## 2024-07-26 LAB — COMPREHENSIVE METABOLIC PANEL WITH GFR
AG Ratio: 1.6 (calc) (ref 1.0–2.5)
ALT: 36 U/L — ABNORMAL HIGH (ref 6–29)
AST: 25 U/L (ref 10–30)
Albumin: 4.4 g/dL (ref 3.6–5.1)
Alkaline phosphatase (APISO): 60 U/L (ref 31–125)
BUN: 11 mg/dL (ref 7–25)
CO2: 27 mmol/L (ref 20–32)
Calcium: 9.4 mg/dL (ref 8.6–10.2)
Chloride: 104 mmol/L (ref 98–110)
Creat: 0.73 mg/dL (ref 0.50–0.97)
Globulin: 2.7 g/dL (ref 1.9–3.7)
Glucose, Bld: 79 mg/dL (ref 65–99)
Potassium: 4.4 mmol/L (ref 3.5–5.3)
Sodium: 140 mmol/L (ref 135–146)
Total Bilirubin: 0.3 mg/dL (ref 0.2–1.2)
Total Protein: 7.1 g/dL (ref 6.1–8.1)
eGFR: 111 mL/min/1.73m2

## 2024-07-26 LAB — LIPID PANEL
Cholesterol: 225 mg/dL — ABNORMAL HIGH
HDL: 61 mg/dL
LDL Cholesterol (Calc): 132 mg/dL — ABNORMAL HIGH
Non-HDL Cholesterol (Calc): 164 mg/dL — ABNORMAL HIGH
Total CHOL/HDL Ratio: 3.7 (calc)
Triglycerides: 188 mg/dL — ABNORMAL HIGH

## 2024-07-26 LAB — CBC WITH DIFFERENTIAL/PLATELET
Absolute Lymphocytes: 4089 {cells}/uL — ABNORMAL HIGH (ref 850–3900)
Absolute Monocytes: 423 {cells}/uL (ref 200–950)
Basophils Absolute: 56 {cells}/uL (ref 0–200)
Basophils Relative: 0.6 %
Eosinophils Absolute: 66 {cells}/uL (ref 15–500)
Eosinophils Relative: 0.7 %
HCT: 44.6 % (ref 35.9–46.0)
Hemoglobin: 14.4 g/dL (ref 11.7–15.5)
MCH: 29.9 pg (ref 27.0–33.0)
MCHC: 32.3 g/dL (ref 31.6–35.4)
MCV: 92.5 fL (ref 81.4–101.7)
MPV: 10.1 fL (ref 7.5–12.5)
Monocytes Relative: 4.5 %
Neutro Abs: 4766 {cells}/uL (ref 1500–7800)
Neutrophils Relative %: 50.7 %
Platelets: 314 Thousand/uL (ref 140–400)
RBC: 4.82 Million/uL (ref 3.80–5.10)
RDW: 11.9 % (ref 11.0–15.0)
Total Lymphocyte: 43.5 %
WBC: 9.4 Thousand/uL (ref 3.8–10.8)

## 2024-07-26 LAB — VITAMIN D 25 HYDROXY (VIT D DEFICIENCY, FRACTURES): Vit D, 25-Hydroxy: 21 ng/mL — ABNORMAL LOW (ref 30–100)

## 2024-07-26 LAB — HEMOGLOBIN A1C
Hgb A1c MFr Bld: 5.6 %
Mean Plasma Glucose: 114 mg/dL
eAG (mmol/L): 6.3 mmol/L

## 2024-07-26 LAB — TSH: TSH: 0.81 m[IU]/L

## 2024-07-28 ENCOUNTER — Ambulatory Visit: Payer: Self-pay | Admitting: Family Medicine

## 2025-01-23 ENCOUNTER — Ambulatory Visit: Admitting: Family Medicine

## 2025-07-31 ENCOUNTER — Encounter: Admitting: Family Medicine
